# Patient Record
Sex: Female | Born: 1965
Health system: Southern US, Community
[De-identification: ages and names within clinical notes are randomized; demographics above are authoritative.]

## PROBLEM LIST (undated history)

## (undated) DIAGNOSIS — R5383 Other fatigue: Secondary | ICD-10-CM

## (undated) DIAGNOSIS — E785 Hyperlipidemia, unspecified: Secondary | ICD-10-CM

## (undated) DIAGNOSIS — M549 Dorsalgia, unspecified: Secondary | ICD-10-CM

## (undated) DIAGNOSIS — M5136 Other intervertebral disc degeneration, lumbar region: Secondary | ICD-10-CM

## (undated) DIAGNOSIS — E041 Nontoxic single thyroid nodule: Secondary | ICD-10-CM

## (undated) DIAGNOSIS — C801 Malignant (primary) neoplasm, unspecified: Secondary | ICD-10-CM

## (undated) DIAGNOSIS — D649 Anemia, unspecified: Secondary | ICD-10-CM

## (undated) DIAGNOSIS — E559 Vitamin D deficiency, unspecified: Secondary | ICD-10-CM

## (undated) DIAGNOSIS — M51369 Other intervertebral disc degeneration, lumbar region without mention of lumbar back pain or lower extremity pain: Secondary | ICD-10-CM

## (undated) DIAGNOSIS — K635 Polyp of colon: Secondary | ICD-10-CM

## (undated) DIAGNOSIS — M25559 Pain in unspecified hip: Secondary | ICD-10-CM

## (undated) DIAGNOSIS — M810 Age-related osteoporosis without current pathological fracture: Secondary | ICD-10-CM

## (undated) DIAGNOSIS — E079 Disorder of thyroid, unspecified: Secondary | ICD-10-CM

## (undated) HISTORY — DX: Vitamin D deficiency, unspecified: E55.9

## (undated) HISTORY — DX: Other fatigue: R53.83

## (undated) HISTORY — DX: Hyperlipidemia, unspecified: E78.5

## (undated) HISTORY — DX: Age-related osteoporosis without current pathological fracture: M81.0

## (undated) HISTORY — DX: Other intervertebral disc degeneration, lumbar region without mention of lumbar back pain or lower extremity pain: M51.369

## (undated) HISTORY — DX: Dorsalgia, unspecified: M54.9

## (undated) HISTORY — DX: Nontoxic single thyroid nodule: E04.1

## (undated) HISTORY — DX: Polyp of colon: K63.5

## (undated) HISTORY — PX: SALPINGOOPHORECTOMY: SHX82

## (undated) HISTORY — DX: Pain in unspecified hip: M25.559

## (undated) HISTORY — DX: Disorder of thyroid, unspecified: E07.9

## (undated) HISTORY — DX: Other intervertebral disc degeneration, lumbar region: M51.36

---

## 1996-08-02 HISTORY — PX: ECTOPIC PREGNANCY SURGERY: SHX613

## 2008-08-02 HISTORY — PX: ENDOMETRIAL ABLATION: SHX621

## 2009-08-02 HISTORY — PX: TUBAL LIGATION: SHX77

## 2010-01-05 ENCOUNTER — Emergency Department (HOSPITAL_BASED_OUTPATIENT_CLINIC_OR_DEPARTMENT_OTHER): Admission: EM | Admit: 2010-01-05 | Discharge: 2010-01-05 | Payer: Self-pay | Admitting: Emergency Medicine

## 2010-01-05 ENCOUNTER — Ambulatory Visit: Payer: Self-pay | Admitting: Radiology

## 2010-10-19 LAB — URINALYSIS, ROUTINE W REFLEX MICROSCOPIC
Glucose, UA: NEGATIVE mg/dL
Specific Gravity, Urine: 1.022 (ref 1.005–1.030)
Urobilinogen, UA: 0.2 mg/dL (ref 0.0–1.0)
pH: 6 (ref 5.0–8.0)

## 2010-10-19 LAB — DIFFERENTIAL
Lymphs Abs: 1.9 10*3/uL (ref 0.7–4.0)
Monocytes Absolute: 0.2 10*3/uL (ref 0.1–1.0)
Monocytes Relative: 5 % (ref 3–12)

## 2010-10-19 LAB — BASIC METABOLIC PANEL
BUN: 12 mg/dL (ref 6–23)
Chloride: 107 mEq/L (ref 96–112)
GFR calc Af Amer: >60 mL/min (ref >60)
GFR calc non Af Amer: 60 mL/min — ABNORMAL LOW (ref >60)
Glucose, Bld: 119 mg/dL — ABNORMAL HIGH (ref 70–99)
Potassium: 3.6 mEq/L (ref 3.5–5.1)
Sodium: 142 mEq/L (ref 135–145)

## 2010-10-19 LAB — CBC
HCT: 36.7 % (ref 36.0–46.0)
MCHC: 33.6 g/dL (ref 30.0–36.0)
MCV: 84.1 fL (ref 78.0–100.0)
RBC: 4.37 MIL/uL (ref 3.87–5.11)
RDW: 15.9 % — ABNORMAL HIGH (ref 11.5–15.5)
WBC: 4.8 10*3/uL (ref 4.0–10.5)

## 2010-10-19 LAB — PREGNANCY, URINE: Preg Test, Ur: NEGATIVE

## 2010-10-19 LAB — URINE MICROSCOPIC-ADD ON

## 2012-09-24 ENCOUNTER — Encounter (HOSPITAL_BASED_OUTPATIENT_CLINIC_OR_DEPARTMENT_OTHER): Payer: Self-pay | Admitting: *Deleted

## 2012-09-24 ENCOUNTER — Emergency Department (HOSPITAL_BASED_OUTPATIENT_CLINIC_OR_DEPARTMENT_OTHER)
Admission: EM | Admit: 2012-09-24 | Discharge: 2012-09-24 | Disposition: A | Discharge status: Home / Self Care | Payer: 59 | Attending: Emergency Medicine | Admitting: Emergency Medicine

## 2012-09-24 DIAGNOSIS — S298XXA Other specified injuries of thorax, initial encounter: Secondary | ICD-10-CM | POA: Insufficient documentation

## 2012-09-24 DIAGNOSIS — R10A0 Flank pain, unspecified side: Secondary | ICD-10-CM

## 2012-09-24 DIAGNOSIS — R109 Unspecified abdominal pain: Secondary | ICD-10-CM

## 2012-09-24 DIAGNOSIS — X58XXXA Exposure to other specified factors, initial encounter: Secondary | ICD-10-CM | POA: Insufficient documentation

## 2012-09-24 DIAGNOSIS — Z9851 Tubal ligation status: Secondary | ICD-10-CM | POA: Insufficient documentation

## 2012-09-24 DIAGNOSIS — Y939 Activity, unspecified: Secondary | ICD-10-CM | POA: Insufficient documentation

## 2012-09-24 DIAGNOSIS — F17200 Nicotine dependence, unspecified, uncomplicated: Secondary | ICD-10-CM | POA: Insufficient documentation

## 2012-09-24 DIAGNOSIS — Y9289 Other specified places as the place of occurrence of the external cause: Secondary | ICD-10-CM | POA: Insufficient documentation

## 2012-09-24 DIAGNOSIS — N390 Urinary tract infection, site not specified: Secondary | ICD-10-CM

## 2012-09-24 DIAGNOSIS — R110 Nausea: Secondary | ICD-10-CM | POA: Insufficient documentation

## 2012-09-24 DIAGNOSIS — Y990 Civilian activity done for income or pay: Secondary | ICD-10-CM | POA: Insufficient documentation

## 2012-09-24 LAB — CBC WITH DIFFERENTIAL/PLATELET
Basophils Absolute: 0 K/uL (ref 0.0–0.1)
Basophils Relative: 0 % (ref 0–1)
Eosinophils Absolute: 0 K/uL (ref 0.0–0.7)
Eosinophils Relative: 1 % (ref 0–5)
HCT: 40.8 % (ref 36.0–46.0)
Hemoglobin: 13.8 g/dL (ref 12.0–15.0)
Lymphocytes Relative: 15 % (ref 12–46)
Lymphs Abs: 1.1 K/uL (ref 0.7–4.0)
MCH: 29.3 pg (ref 26.0–34.0)
MCHC: 33.8 g/dL (ref 30.0–36.0)
MCV: 86.6 fL (ref 78.0–100.0)
Monocytes Absolute: 0.3 K/uL (ref 0.1–1.0)
Monocytes Relative: 4 % (ref 3–12)
Neutro Abs: 6.2 K/uL (ref 1.7–7.7)
Neutrophils Relative %: 81 % — ABNORMAL HIGH (ref 43–77)
Platelets: 179 K/uL (ref 150–400)
RBC: 4.71 MIL/uL (ref 3.87–5.11)
RDW: 13.5 % (ref 11.5–15.5)
WBC: 7.7 K/uL (ref 4.0–10.5)

## 2012-09-24 LAB — COMPREHENSIVE METABOLIC PANEL WITH GFR
ALT: 14 U/L (ref 0–35)
AST: 18 U/L (ref 0–37)
Albumin: 4 g/dL (ref 3.5–5.2)
Alkaline Phosphatase: 78 U/L (ref 39–117)
BUN: 11 mg/dL (ref 6–23)
CO2: 25 meq/L (ref 19–32)
Calcium: 9.1 mg/dL (ref 8.4–10.5)
Chloride: 105 meq/L (ref 96–112)
Creatinine, Ser: 0.9 mg/dL (ref 0.50–1.10)
GFR calc Af Amer: 86 mL/min — ABNORMAL LOW (ref >90)
GFR calc non Af Amer: 74 mL/min — ABNORMAL LOW (ref >90)
Glucose, Bld: 94 mg/dL (ref 70–99)
Potassium: 3.7 meq/L (ref 3.5–5.1)
Sodium: 139 meq/L (ref 135–145)
Total Bilirubin: 0.3 mg/dL (ref 0.3–1.2)
Total Protein: 7.1 g/dL (ref 6.0–8.3)

## 2012-09-24 LAB — URINALYSIS, ROUTINE W REFLEX MICROSCOPIC
Bilirubin Urine: NEGATIVE
Glucose, UA: NEGATIVE mg/dL
Hgb urine dipstick: NEGATIVE
Ketones, ur: NEGATIVE mg/dL
Nitrite: POSITIVE — AB
Protein, ur: NEGATIVE mg/dL
Specific Gravity, Urine: 1.02 (ref 1.005–1.030)
Urobilinogen, UA: 0.2 mg/dL (ref 0.0–1.0)
pH: 7 (ref 5.0–8.0)

## 2012-09-24 LAB — URINE MICROSCOPIC-ADD ON

## 2012-09-24 MED ORDER — HYDROCODONE-ACETAMINOPHEN 5-325 MG PO TABS: 2.0000 | ORAL_TABLET | Freq: Four times a day (QID) | ORAL | Status: DC | PRN

## 2012-09-24 MED ORDER — CIPROFLOXACIN HCL 250 MG PO TABS: 250.0000 mg | ORAL_TABLET | Freq: Two times a day (BID) | ORAL | Status: DC

## 2012-09-24 NOTE — ED Provider Notes (Signed)
History     CSN: [id number]  Arrival date & time 09/24/12  1155   First MD Initiated Contact with Patient 09/24/12 1220      Chief Complaint  Patient presents with  . Rib Injury    (Consider location/radiation/quality/duration/timing/severity/associated sxs/prior treatment) HPI Comments: Patient states she was at work on Friday, tripped, and grabbed the railing on the stairs to keep from falling.  For the past two days she has had worsening discomfort in the left upper abdomen.  There is no n/v/d.  She denies urinary or bowel complaints.  There are no fevers or chills.    The history is provided by the patient.    History reviewed. No pertinent past medical history.  Past Surgical History  Procedure Laterality Date  . Tubal ligation      History reviewed. No pertinent family history.  History  Substance Use Topics  . Smoking status: Current Some Day Smoker  . Smokeless tobacco: Not on file  . Alcohol Use: No    OB History   Grav Para Term Preterm Abortions TAB SAB Ect Mult Living                  Review of Systems  All other systems reviewed and are negative.    Allergies  Review of patient's allergies indicates no known allergies.  Home Medications  No current outpatient prescriptions on file.  BP 152/90  Pulse 61  Temp(Src) 97.6 F (36.4 C) (Oral)  Resp 20  Ht 5\' 3"  (1.6 m)  Wt 170 lb (77.111 kg)  BMI 30.12 kg/m2  SpO2 100%  LMP 08/24/2012  Physical Exam  Nursing note and vitals reviewed. Constitutional: She is oriented to person, place, and time. She appears well-developed and well-nourished. No distress.  HENT:  Head: Normocephalic and atraumatic.  Neck: Normal range of motion. Neck supple.  Cardiovascular: Normal rate and regular rhythm.  Exam reveals no gallop and no friction rub.   No murmur heard. Pulmonary/Chest: Effort normal and breath sounds normal. No respiratory distress. She has no wheezes.  Abdominal: Soft. Bowel sounds are  normal. She exhibits no distension.  There is mild ttp in the left upper quadrant without rebound or guarding.  Bowel sounds are present.   Musculoskeletal: Normal range of motion.  Neurological: She is alert and oriented to person, place, and time.  Skin: Skin is warm and dry. She is not diaphoretic.    ED Course  Procedures (including critical care time)  Labs Reviewed - No data to display No results found.   No diagnosis found.    MDM  Patient presents here with symptoms of left upper quadrant and flank discomfort that started the day after a near fall at work.  There is no elevation of wbc or lipase.  The ua shows uti that i suspect is the cause of her symptoms.  Will treat with cipro, pain meds, follow up prn.        Geoffery Lyons, MD 09/24/12 1335

## 2012-09-24 NOTE — ED Notes (Signed)
Pt states she was at work Friday and tripped on steps. Tried to catch herself and ? Pulled something in her left rib/side area. Pain worse today +nausea at times

## 2012-09-26 LAB — URINE CULTURE: Culture: NO GROWTH

## 2014-01-24 HISTORY — PX: COLONOSCOPY: SHX174

## 2014-07-09 ENCOUNTER — Encounter: Payer: Self-pay | Admitting: Internal Medicine

## 2014-08-29 ENCOUNTER — Ambulatory Visit: Payer: Self-pay | Admitting: Internal Medicine

## 2014-09-26 ENCOUNTER — Ambulatory Visit (INDEPENDENT_AMBULATORY_CARE_PROVIDER_SITE_OTHER): Payer: PRIVATE HEALTH INSURANCE | Admitting: Internal Medicine

## 2014-09-26 ENCOUNTER — Encounter: Payer: Self-pay | Admitting: Internal Medicine

## 2014-09-26 VITALS — BP 118/78 | HR 83 | Temp 97.9°F | Resp 14 | Ht 64.0 in | Wt 186.8 lb

## 2014-09-26 DIAGNOSIS — C73 Malignant neoplasm of thyroid gland: Secondary | ICD-10-CM

## 2014-09-26 DIAGNOSIS — E041 Nontoxic single thyroid nodule: Secondary | ICD-10-CM | POA: Insufficient documentation

## 2014-09-26 NOTE — Progress Notes (Addendum)
Patient ID: Melinda Vazquez, female   DOB: 02-10-1966, 49 y.o.   MRN: [id number]   HPI  Melinda Vazquez is a 49 y.o.-year-old female, referred by her PCP, Dr. Lennice Sites [facility]), for management of a R thyroid nodule.  Pt was seen by PCP in 06/2014 and c/o feeling a neck mass >> she was sent for a thyroid U/S.  Thyroid U/S ([facility], 06/26/2014): 3.7 x 4.1 x 1.9 cm, complex, vascular.  Pt denies feeling nodules in neck, hoarseness, dysphagia/odynophagia, SOB with lying down.  Last TSH: 06/26/2014: TSH 0.45  Pt c/o: - + hot flushes - + weight gain (7-8 lbs in last 7-8 mo) - no tremors - no palpitations - no anxiety/depression - no hyperdefecation/+ off and on constipation - no dry skin - no hair falling - no fatigue  Pt does have a FH of thyroid ds. in daughter - thyroid cancer (? Type). No h/o radiation tx to head or neck.  No seaweed or kelp, no recent contrast studies. No steroid use. No herbal supplements.   ROS: Constitutional: no weight gain/loss, no fatigue, no subjective hyperthermia/hypothermia Eyes: no blurry vision, no xerophthalmia ENT: no sore throat, no nodules palpated in throat, no dysphagia/odynophagia, no hoarseness Cardiovascular: no CP/SOB/palpitations/leg swelling Respiratory: no cough/SOB Gastrointestinal: no N/V/D/+ C Musculoskeletal: no muscle/+ joint aches Skin: no rashes Neurological: no tremors/numbness/tingling/dizziness Psychiatric: no depression/anxiety + low libido  Past Medical History  Diagnosis Date  . Hyperlipidemia   . Vitamin D deficiency   . Degenerative disc disease, lumbar     L5-S1  . Osteoporosis   . Fatigue   . Acute back pain   . Acute hip pain   . Colon polyp   . Thyroid mass   . Thyroid nodule    Past Surgical History  Procedure Laterality Date  . Tubal ligation    . Salpingoophorectomy    . Endometrial ablation    . Colonoscopy  01/24/14   History   Social History  . Marital Status: Single   Spouse Name: N/A  . Number of Children: 2   Occupational History  . Small business customer svs rep   Social History Main Topics  . Smoking status: Current Some Day Smoker  . Smokeless tobacco: Not on file  . Alcohol Use: Occasional, red wine  . Drug Use: No  . Sexual Activity: Yes    Birth Control/ Protection: Surgical   Social History Narrative   Bone Density Study: 09/06/13   Pap Smear: 12/2012   Mammogram: 09/2012      Current Outpatient Prescriptions on File Prior to Visit  Medication Sig Dispense Refill  . HYDROcodone-acetaminophen (NORCO/VICODIN) 5-325 MG per tablet Take 2 tablets by mouth every 6 (six) hours as needed for pain. 20 tablet 0  . ciprofloxacin (CIPRO) 250 MG tablet Take 1 tablet (250 mg total) by mouth every 12 (twelve) hours. (Patient not taking: Reported on 09/26/2014) 10 tablet 0   No current facility-administered medications on file prior to visit.   No Known Allergies Family History  Problem Relation Age of Onset  . Diabetes Mother     DM Type II   . Hyperlipidemia Mother   . Hypertension Mother   . Diabetes Father     DM Type II   . Hyperlipidemia Father   . Heart disease Father   . Hypertension Father    PE: BP 118/78 mmHg  Pulse 83  Temp(Src) 97.9 F (36.6 C) (Oral)  Resp 14  Ht  5\' 4"  (1.626 m)  Wt 186 lb 12.8 oz (84.732 kg)  BMI 32.05 kg/m2  SpO2 98% Wt Readings from Last 3 Encounters:  09/26/14 186 lb 12.8 oz (84.732 kg)  09/24/12 170 lb (77.111 kg)   Constitutional: overweight, in NAD Eyes: PERRLA, EOMI, no exophthalmos ENT: moist mucous membranes, + thyromegaly - R lower lobe palpable, no cervical lymphadenopathy Cardiovascular: RRR, No MRG Respiratory: CTA B Gastrointestinal: abdomen soft, NT, ND, BS+ Musculoskeletal: no deformities, strength intact in all 4;  Skin: moist, warm, no rashes Neurological: no tremor with outstretched hands, DTR normal in all 4  ASSESSMENT: 1. R thyroid nodule  PLAN: 1. R thyroid nodule - I  reviewed the report of her thyroid ultrasound from [place] (images not available) along with the patient. I pointed out that the dominant nodule is large, this being a risk factor for cancer. Pt's daughter had thyroid cancer (? type). No personal history of RxTx to head/neck.  - the only way that we can tell exactly if it is cancer or not is by doing a thyroid biopsy (FNA). I explained what the test entails. - We discussed about other options, to wait for another 6 months to a year and see if the nodule grows, and only intervene at that time.  - I explained that this is not cancer, we can continue to follow her on a yearly basis, and check another ultrasound in another year or 2. - patient decided to have the FNA done now >> I ordered this. We discussed about the FNA technique and also given her written instructions. - I'll see her back in a year, assuming her FNA is normal. If FNA abnormal, we will meet sooner.  - I will not repeat the thyroid tests since they were normal before (we called PCP's office and obtain the latest TSH from 06/2014) - I advised pt to join my chart and I will send her the results through there or call her  Orders Placed This Encounter  Procedures  . US Thyroid Biopsy   Bx scheduled for 10/15/2014. I will addend the results when they become available.  Adequacy Reason Satisfactory For Evaluation. Diagnosis THYROID, FINE NEEDLE ASPIRATION, MIDLINE INFERIOR ISTHMUS (SPECIMEN 1 OF 1, COLLECTED ON 10/15/14): MALIGNANT ([facility] CATEGORY VI). FINDINGS CONSISTENT WITH PAPILLARY THYROID CARCINOMA. [name] MD Pathologist, Electronic Signature (Case signed 10/16/2014) Specimen Clinical Information 3.7 x 4.1 x 1.9 cm heterogeneous hypervascular nodule inferior thyroid isthmus Source Thyroid, Fine Needle Aspiration, Midline Inferior Isthmus, (Specimen 1 of 1, collected on 10/15/14 )  Called and d/w pt >> agrees with referral to surgery.

## 2014-09-26 NOTE — Patient Instructions (Signed)
You will be called with the schedule for the Biopsy.  Please return in 1 year.  Thyroid Biopsy The thyroid gland is a butterfly-shaped gland situated in the front of the neck. It produces hormones which affect metabolism, growth and development, and body temperature. A thyroid biopsy is a procedure in which small samples of tissue or fluid are removed from the thyroid gland or mass and examined under a microscope. This test is done to determine the cause of thyroid problems, such as infection, cancer, or other thyroid problems. There are 2 ways to obtain samples: 1. Fine needle biopsy. Samples are removed using a thin needle inserted through the skin and into the thyroid gland or mass. 2. Open biopsy. Samples are removed after a cut (incision) is made through the skin. LET YOUR CAREGIVER KNOW ABOUT:   Allergies.  Medications taken including herbs, eye drops, over-the-counter medications, and creams.  Use of steroids (by mouth or creams).  Previous problems with anesthetics or numbing medicine.  Possibility of pregnancy, if this applies.  History of blood clots (thrombophlebitis).  History of bleeding or blood problems.  Previous surgery.  Other health problems. RISKS AND COMPLICATIONS  Bleeding from the site. The risk of bleeding is higher if you have a bleeding disorder or are taking any blood thinning medications (anticoagulants).  Infection.  Injury to structures near the thyroid gland. BEFORE THE PROCEDURE  This is a procedure that can be done as an outpatient. Confirm the time that you need to arrive for your procedure. Confirm whether there is a need to fast or withhold any medications. A blood sample may be done to determine your blood clotting time. Medicine may be given to help you relax (sedative). PROCEDURE Fine needle biopsy. You will be awake during the procedure. You may be asked to lie on your back with your head tipped backward to extend your neck. Let your  caregiver know if you cannot tolerate the positioning. An area on your neck will be cleansed. A needle is inserted through the skin of your neck. You may feel a mild discomfort during this procedure. You may be asked to avoid coughing, talking, swallowing, or making sounds during some portions of the procedure. The needle is withdrawn once tissue or fluid samples have been removed. Pressure may be applied to the neck to reduce swelling and ensure that bleeding has stopped. The samples will be sent for examination.  Open biopsy. You will be given general anesthesia. You will be asleep during the procedure. An incision is made in your neck. A sample of thyroid tissue or the mass is removed. The tissue sample or mass will be sent for examination. The sample or mass may be examined during the biopsy. If the sample or mass contains cancer cells, some or all of the thyroid gland may be removed. The incision is closed with stitches. AFTER THE PROCEDURE  Your recovery will be assessed and monitored. If there are no problems, as an outpatient, you should be able to go home shortly after the procedure. If you had a fine needle biopsy:  You may have soreness at the biopsy site for 1 to 2 days. If you had an open biopsy:   You may have soreness at the biopsy site for 3 to 4 days.  You may have a hoarse voice or sore throat for 1 to 2 days. Obtaining the Test Results It is your responsibility to obtain your test results. Do not assume everything is normal if you have  not heard from your caregiver or the medical facility. It is important for you to follow up on all of your test results. HOME CARE INSTRUCTIONS   Keeping your head raised on a pillow when you are lying down may ease biopsy site discomfort.  Supporting the back of your head and neck with both hands as you sit up from a lying position may ease biopsy site discomfort.  Only take over-the-counter or prescription medicines for pain, discomfort, or  fever as directed by your caregiver.  Throat lozenges or gargling with warm salt water may help to soothe a sore throat. SEEK IMMEDIATE MEDICAL CARE IF:   You have severe bleeding from the biopsy site.  You have difficulty swallowing.  You have a fever.  You have increased pain, swelling, redness, or warmth at the biopsy site.  You notice pus coming from the biopsy site.  You have swollen glands (lymph nodes) in your neck. Document Released: 05/16/2007 Document Revised: 11/13/2012 Document Reviewed: 10/11/2013 [facility] Patient Information 2015 [place], [place]. This information is not intended to replace advice given to you by your health care provider. Make sure you discuss any questions you have with your health care provider.

## 2014-10-01 DIAGNOSIS — C801 Malignant (primary) neoplasm, unspecified: Secondary | ICD-10-CM

## 2014-10-01 HISTORY — DX: Malignant (primary) neoplasm, unspecified: C80.1

## 2014-10-01 HISTORY — PX: BIOPSY THYROID: PRO38

## 2014-10-15 ENCOUNTER — Other Ambulatory Visit (HOSPITAL_COMMUNITY)
Admission: RE | Admit: 2014-10-15 | Discharge: 2014-10-15 | Disposition: A | Discharge status: Home / Self Care | Payer: PRIVATE HEALTH INSURANCE | Source: Ambulatory Visit | Attending: Interventional Radiology | Admitting: Interventional Radiology

## 2014-10-15 ENCOUNTER — Ambulatory Visit
Admission: RE | Admit: 2014-10-15 | Discharge: 2014-10-15 | Disposition: A | Discharge status: Home / Self Care | Payer: PRIVATE HEALTH INSURANCE | Source: Ambulatory Visit | Attending: Internal Medicine | Admitting: Internal Medicine

## 2014-10-15 DIAGNOSIS — E041 Nontoxic single thyroid nodule: Secondary | ICD-10-CM | POA: Insufficient documentation

## 2014-10-16 ENCOUNTER — Telehealth: Payer: Self-pay | Admitting: *Deleted

## 2014-10-16 NOTE — Telephone Encounter (Signed)
Dr Saralyn Pilar with Pathology at [facility] called stating the diagnosis of the pt's biopsy is malignant papillary thyroid carcinoma. Be advised.

## 2014-10-17 NOTE — Addendum Note (Signed)
Addended by: Philemon Kingdom on: 10/17/2014 06:29 PM   Modules accepted: Orders, Level of Service

## 2014-10-21 ENCOUNTER — Telehealth: Payer: Self-pay | Admitting: Internal Medicine

## 2014-10-21 NOTE — Telephone Encounter (Signed)
Left a message for the pt to return my call.  

## 2014-10-21 NOTE — Telephone Encounter (Signed)
I left a detailed message with the information below and asked that she call me back.

## 2014-10-21 NOTE — Telephone Encounter (Signed)
Completed-see prior note.

## 2014-10-21 NOTE — Telephone Encounter (Signed)
Pt needs call back regarding questions for D.r Cruzita Lederer

## 2014-10-21 NOTE — Telephone Encounter (Signed)
Patient states when she was seen her mind was wondering at the last visit and she wanted to clarify if Dr Cruzita Lederer told her she has cancer cells in the thyroid?  Requests Dr Cruzita Lederer call her if possible when she goes to [facility] around 1:30pm and if she does not answer, it is OK to leave a message with this information.

## 2014-10-21 NOTE — Telephone Encounter (Signed)
Please call pt >> yes, as I talked with her over the phone, the pathology was positive for cancer in the biopsied nodule: "malignant papillary thyroid carcinoma". Was she called by the surgeon's office?

## 2014-10-21 NOTE — Telephone Encounter (Signed)
I spoke with the pt and explained to her per Dr Cruzita Lederer there was cancer noticed only in the cells that were removed; not in the entire thyroid and she agreed.  Also stated the surgeons office has called her.

## 2014-10-21 NOTE — Telephone Encounter (Signed)
Patient called stating she was returning Dr. Cruzita Lederer call   Please advise patient    Thank you

## 2014-11-07 ENCOUNTER — Ambulatory Visit: Payer: Self-pay | Admitting: Surgery

## 2014-11-22 NOTE — Patient Instructions (Addendum)
YOUR PROCEDURE IS SCHEDULED ON :  11/29/14  REPORT TO [facility] MAIN ENTRANCE FOLLOW SIGNS TO SHORT STAY CENTER AT :  5:30 AM  CALL THIS NUMBER IF YOU HAVE PROBLEMS THE MORNING OF SURGERY [phone]  REMEMBER:  DO NOT EAT FOOD OR DRINK LIQUIDS AFTER MIDNIGHT   STOP ASPIRIN / IBUPROFEN / HERBAL MEDS / VITAMINS 5 DAYS PREOP  YOU MAY NOT HAVE ANY METAL ON YOUR BODY INCLUDING HAIR PINS AND PIERCING'S. DO NOT WEAR JEWELRY, MAKEUP, LOTIONS, POWDERS OR PERFUMES. DO NOT WEAR NAIL POLISH. DO NOT SHAVE 48 HRS PRIOR TO SURGERY. MEN MAY SHAVE FACE AND NECK.  DO NOT [facility]. [facility] IS NOT RESPONSIBLE FOR VALUABLES.  CONTACTS, DENTURES OR PARTIALS MAY NOT BE WORN TO SURGERY. LEAVE SUITCASE IN CAR. CAN BE BROUGHT TO ROOM AFTER SURGERY.  PATIENTS DISCHARGED THE DAY OF SURGERY WILL NOT BE ALLOWED TO DRIVE HOME.  PLEASE READ OVER THE FOLLOWING INSTRUCTION SHEETS _________________________________________________________________________________                                          [place] - PREPARING FOR SURGERY  Before surgery, you can play an important role.  Because skin is not sterile, your skin needs to be as free of germs as possible.  You can reduce the number of germs on your skin by washing with CHG (chlorahexidine gluconate) soap before surgery.  CHG is an antiseptic cleaner which kills germs and bonds with the skin to continue killing germs even after washing. Please DO NOT use if you have an allergy to CHG or antibacterial soaps.  If your skin becomes reddened/irritated stop using the CHG and inform your nurse when you arrive at Short Stay. Do not shave (including legs and underarms) for at least 48 hours prior to the first CHG shower.  You may shave your face. Please follow these instructions carefully:   1.  Shower with CHG Soap the night before surgery and the  morning of Surgery.   2.  If you choose to wash your hair, wash your  hair first as usual with your  normal  Shampoo.   3.  After you shampoo, rinse your hair and body thoroughly to remove the  shampoo.                                         4.  Use CHG as you would any other liquid soap.  You can apply chg directly  to the skin and wash . Gently wash with scrungie or clean wascloth    5.  Apply the CHG Soap to your body ONLY FROM THE NECK DOWN.   Do not use on open                           Wound or open sores. Avoid contact with eyes, ears mouth and genitals (private parts).                        Genitals (private parts) with your normal soap.              6.  Wash thoroughly, paying special attention to the area where your surgery  will be performed.   7.  Thoroughly rinse your body with warm water from the neck down.   8.  DO NOT shower/wash with your normal soap after using and rinsing off  the CHG Soap .                9.  Pat yourself dry with a clean towel.             10.  Wear clean night clothes to bed after shower             11.  Place clean sheets on your bed the night of your first shower and do not  sleep with pets.  Day of Surgery : Do not apply any lotions/deodorants the morning of surgery.  Please wear clean clothes to the hospital/surgery center.  FAILURE TO FOLLOW THESE INSTRUCTIONS MAY RESULT IN THE CANCELLATION OF YOUR SURGERY    PATIENT SIGNATURE_________________________________  ______________________________________________________________________

## 2014-11-25 ENCOUNTER — Ambulatory Visit (HOSPITAL_COMMUNITY)
Admission: RE | Admit: 2014-11-25 | Discharge: 2014-11-25 | Disposition: A | Discharge status: Home / Self Care | Payer: 59 | Source: Ambulatory Visit | Attending: Anesthesiology | Admitting: Anesthesiology

## 2014-11-25 ENCOUNTER — Encounter (HOSPITAL_COMMUNITY): Payer: Self-pay

## 2014-11-25 ENCOUNTER — Encounter (HOSPITAL_COMMUNITY)
Admission: RE | Admit: 2014-11-25 | Discharge: 2014-11-25 | Disposition: A | Discharge status: Home / Self Care | Payer: 59 | Source: Ambulatory Visit | Attending: Surgery | Admitting: Surgery

## 2014-11-25 DIAGNOSIS — Z01818 Encounter for other preprocedural examination: Secondary | ICD-10-CM | POA: Diagnosis not present

## 2014-11-25 DIAGNOSIS — E079 Disorder of thyroid, unspecified: Secondary | ICD-10-CM | POA: Diagnosis not present

## 2014-11-25 HISTORY — DX: Anemia, unspecified: D64.9

## 2014-11-25 HISTORY — DX: Malignant (primary) neoplasm, unspecified: C80.1

## 2014-11-25 LAB — BASIC METABOLIC PANEL
Anion gap: 11 (ref 5–15)
BUN: 17 mg/dL (ref 6–23)
CHLORIDE: 107 mmol/L (ref 96–112)
CO2: 26 mmol/L (ref 19–32)
Calcium: 9.9 mg/dL (ref 8.4–10.5)
Creatinine, Ser: 1.02 mg/dL (ref 0.50–1.10)
GFR calc non Af Amer: 63 mL/min — ABNORMAL LOW (ref >90)
GFR, EST AFRICAN AMERICAN: 73 mL/min — AB (ref >90)
Glucose, Bld: 104 mg/dL — ABNORMAL HIGH (ref 70–99)
POTASSIUM: 3.9 mmol/L (ref 3.5–5.1)
SODIUM: 144 mmol/L (ref 135–145)

## 2014-11-25 LAB — CBC
HCT: 42.4 % (ref 36.0–46.0)
Hemoglobin: 13.8 g/dL (ref 12.0–15.0)
MCH: 28.6 pg (ref 26.0–34.0)
MCHC: 32.5 g/dL (ref 30.0–36.0)
MCV: 87.8 fL (ref 78.0–100.0)
Platelets: 220 10*3/uL (ref 150–400)
RBC: 4.83 MIL/uL (ref 3.87–5.11)
RDW: 13.7 % (ref 11.5–15.5)
WBC: 4 10*3/uL (ref 4.0–10.5)

## 2014-11-25 NOTE — Progress Notes (Signed)
LOV note 11/04/14 Melinda Vazquez [facility] on chart, Chest x-ray and EKG 09/06/13 on chart, ECHO 10/23/14 on chart

## 2014-11-26 NOTE — Progress Notes (Signed)
Quick Note:  Pre-operative chest x-ray is acceptable for scheduled surgery.  Katalaya Beel M. Camarie Mctigue, MD, FACS [facility] Office: [phone]   ______ 

## 2014-11-26 NOTE — Progress Notes (Signed)
Quick Note:  These results are acceptable for scheduled surgery.  Anelise Staron M. Aluel Schwarz, MD, FACS [facility] Office: [phone]   ______ 

## 2014-11-29 ENCOUNTER — Ambulatory Visit (HOSPITAL_COMMUNITY): Payer: 59 | Admitting: Anesthesiology

## 2014-11-29 ENCOUNTER — Encounter (HOSPITAL_COMMUNITY): Payer: Self-pay | Admitting: *Deleted

## 2014-11-29 ENCOUNTER — Encounter (HOSPITAL_COMMUNITY)
Admission: RE | Disposition: A | Discharge status: Home / Self Care | Payer: Self-pay | Source: Ambulatory Visit | Attending: Surgery

## 2014-11-29 ENCOUNTER — Observation Stay (HOSPITAL_COMMUNITY)
Admission: RE | Admit: 2014-11-29 | Discharge: 2014-11-30 | Disposition: A | Discharge status: Home / Self Care | Payer: 59 | Source: Ambulatory Visit | Attending: Surgery | Admitting: Surgery

## 2014-11-29 DIAGNOSIS — M1990 Unspecified osteoarthritis, unspecified site: Secondary | ICD-10-CM | POA: Insufficient documentation

## 2014-11-29 DIAGNOSIS — F17200 Nicotine dependence, unspecified, uncomplicated: Secondary | ICD-10-CM | POA: Diagnosis not present

## 2014-11-29 DIAGNOSIS — C73 Malignant neoplasm of thyroid gland: Principal | ICD-10-CM | POA: Diagnosis present

## 2014-11-29 HISTORY — PX: LYMPH NODE DISSECTION: SHX5087

## 2014-11-29 HISTORY — PX: THYROIDECTOMY: SHX17

## 2014-11-29 SURGERY — THYROIDECTOMY
Anesthesia: General | Site: Neck

## 2014-11-29 MED ORDER — PROPOFOL 10 MG/ML IV BOLUS
INTRAVENOUS | Status: AC
  Filled 2014-11-29: qty 20

## 2014-11-29 MED ORDER — CEFAZOLIN SODIUM-DEXTROSE 2-3 GM-% IV SOLR
2.0000 g | INTRAVENOUS | Status: AC
  Administered 2014-11-29: 2 g via INTRAVENOUS

## 2014-11-29 MED ORDER — FENTANYL CITRATE (PF) 100 MCG/2ML IJ SOLN
25.0000 ug | INTRAMUSCULAR | Status: DC | PRN
  Administered 2014-11-29 (×2): 50 ug via INTRAVENOUS

## 2014-11-29 MED ORDER — ROCURONIUM BROMIDE 100 MG/10ML IV SOLN
INTRAVENOUS | Status: DC | PRN
  Administered 2014-11-29: 40 mg via INTRAVENOUS

## 2014-11-29 MED ORDER — DEXAMETHASONE SODIUM PHOSPHATE 10 MG/ML IJ SOLN
INTRAMUSCULAR | Status: AC
  Filled 2014-11-29: qty 1

## 2014-11-29 MED ORDER — MIDAZOLAM HCL 2 MG/2ML IJ SOLN
INTRAMUSCULAR | Status: AC
  Filled 2014-11-29: qty 2

## 2014-11-29 MED ORDER — GLYCOPYRROLATE 0.2 MG/ML IJ SOLN
INTRAMUSCULAR | Status: DC | PRN
  Administered 2014-11-29: 0.6 mg via INTRAVENOUS

## 2014-11-29 MED ORDER — NEOSTIGMINE METHYLSULFATE 10 MG/10ML IV SOLN
INTRAVENOUS | Status: DC | PRN
  Administered 2014-11-29: 4 mg via INTRAVENOUS

## 2014-11-29 MED ORDER — HYDROCODONE-ACETAMINOPHEN 5-325 MG PO TABS
1.0000 | ORAL_TABLET | ORAL | Status: DC | PRN
  Administered 2014-11-29 – 2014-11-30 (×2): 1 via ORAL
  Filled 2014-11-29 (×2): qty 1

## 2014-11-29 MED ORDER — CALCIUM CARBONATE 1250 (500 CA) MG PO TABS
2.0000 | ORAL_TABLET | Freq: Three times a day (TID) | ORAL | Status: DC
  Administered 2014-11-29 – 2014-11-30 (×3): 1000 mg via ORAL
  Filled 2014-11-29 (×7): qty 2

## 2014-11-29 MED ORDER — FENTANYL CITRATE (PF) 100 MCG/2ML IJ SOLN
INTRAMUSCULAR | Status: DC | PRN
  Administered 2014-11-29 (×4): 50 ug via INTRAVENOUS

## 2014-11-29 MED ORDER — GLYCOPYRROLATE 0.2 MG/ML IJ SOLN
INTRAMUSCULAR | Status: AC
  Filled 2014-11-29: qty 3

## 2014-11-29 MED ORDER — SYNTHROID 100 MCG PO TABS: 100.0000 ug | ORAL_TABLET | Freq: Every day | ORAL | Status: DC

## 2014-11-29 MED ORDER — MIDAZOLAM HCL 5 MG/5ML IJ SOLN
INTRAMUSCULAR | Status: DC | PRN
  Administered 2014-11-29: 2 mg via INTRAVENOUS

## 2014-11-29 MED ORDER — PROPOFOL 10 MG/ML IV BOLUS
INTRAVENOUS | Status: DC | PRN
  Administered 2014-11-29: 160 mg via INTRAVENOUS

## 2014-11-29 MED ORDER — FENTANYL CITRATE (PF) 100 MCG/2ML IJ SOLN
INTRAMUSCULAR | Status: AC
  Filled 2014-11-29: qty 2

## 2014-11-29 MED ORDER — LACTATED RINGERS IV SOLN
INTRAVENOUS | Status: DC | PRN
  Administered 2014-11-29: 07:00:00 via INTRAVENOUS

## 2014-11-29 MED ORDER — LACTATED RINGERS IV SOLN: INTRAVENOUS | Status: DC

## 2014-11-29 MED ORDER — PROMETHAZINE HCL 25 MG/ML IJ SOLN: 6.2500 mg | INTRAMUSCULAR | Status: DC | PRN

## 2014-11-29 MED ORDER — DEXAMETHASONE SODIUM PHOSPHATE 10 MG/ML IJ SOLN
INTRAMUSCULAR | Status: DC | PRN
  Administered 2014-11-29: 10 mg via INTRAVENOUS

## 2014-11-29 MED ORDER — ONDANSETRON HCL 4 MG/2ML IJ SOLN
INTRAMUSCULAR | Status: AC
  Filled 2014-11-29: qty 2

## 2014-11-29 MED ORDER — FENTANYL CITRATE (PF) 250 MCG/5ML IJ SOLN
INTRAMUSCULAR | Status: AC
  Filled 2014-11-29: qty 5

## 2014-11-29 MED ORDER — NEOSTIGMINE METHYLSULFATE 10 MG/10ML IV SOLN
INTRAVENOUS | Status: AC
  Filled 2014-11-29: qty 1

## 2014-11-29 MED ORDER — HYDROMORPHONE HCL 1 MG/ML IJ SOLN
1.0000 mg | INTRAMUSCULAR | Status: DC | PRN
  Administered 2014-11-29 (×2): 1 mg via INTRAVENOUS
  Filled 2014-11-29 (×2): qty 1

## 2014-11-29 MED ORDER — LIDOCAINE HCL (CARDIAC) 20 MG/ML IV SOLN
INTRAVENOUS | Status: DC | PRN
  Administered 2014-11-29: 100 mg via INTRAVENOUS

## 2014-11-29 MED ORDER — ONDANSETRON HCL 4 MG PO TABS: 4.0000 mg | ORAL_TABLET | Freq: Four times a day (QID) | ORAL | Status: DC | PRN

## 2014-11-29 MED ORDER — ONDANSETRON HCL 4 MG/2ML IJ SOLN
4.0000 mg | Freq: Four times a day (QID) | INTRAMUSCULAR | Status: DC | PRN
  Administered 2014-11-29 (×2): 4 mg via INTRAVENOUS
  Filled 2014-11-29 (×2): qty 2

## 2014-11-29 MED ORDER — CEFAZOLIN SODIUM-DEXTROSE 2-3 GM-% IV SOLR
INTRAVENOUS | Status: AC
  Filled 2014-11-29: qty 50

## 2014-11-29 MED ORDER — LIDOCAINE HCL (CARDIAC) 20 MG/ML IV SOLN
INTRAVENOUS | Status: AC
  Filled 2014-11-29: qty 5

## 2014-11-29 MED ORDER — MEPERIDINE HCL 50 MG/ML IJ SOLN: 6.2500 mg | INTRAMUSCULAR | Status: DC | PRN

## 2014-11-29 MED ORDER — ROCURONIUM BROMIDE 100 MG/10ML IV SOLN
INTRAVENOUS | Status: AC
  Filled 2014-11-29: qty 1

## 2014-11-29 MED ORDER — ONDANSETRON HCL 4 MG/2ML IJ SOLN
INTRAMUSCULAR | Status: DC | PRN
  Administered 2014-11-29: 4 mg via INTRAVENOUS

## 2014-11-29 MED ORDER — SUCCINYLCHOLINE CHLORIDE 20 MG/ML IJ SOLN
INTRAMUSCULAR | Status: DC | PRN
  Administered 2014-11-29: 100 mg via INTRAVENOUS

## 2014-11-29 MED ORDER — OXYCODONE HCL 5 MG PO TABS: 5.0000 mg | ORAL_TABLET | Freq: Four times a day (QID) | ORAL | Status: DC | PRN

## 2014-11-29 MED ORDER — KCL IN DEXTROSE-NACL 20-5-0.45 MEQ/L-%-% IV SOLN
INTRAVENOUS | Status: DC
  Administered 2014-11-29: 13:00:00 via INTRAVENOUS
  Filled 2014-11-29 (×2): qty 1000

## 2014-11-29 MED ORDER — ACETAMINOPHEN 325 MG PO TABS: 650.0000 mg | ORAL_TABLET | ORAL | Status: DC | PRN

## 2014-11-29 MED ORDER — 0.9 % SODIUM CHLORIDE (POUR BTL) OPTIME
TOPICAL | Status: DC | PRN
  Administered 2014-11-29: 1000 mL

## 2014-11-29 SURGICAL SUPPLY — 36 items
ATTRACTOMAT 16X20 MAGNETIC DRP (DRAPES) ×2 IMPLANT
BENZOIN TINCTURE PRP APPL 2/3 (GAUZE/BANDAGES/DRESSINGS) ×2 IMPLANT
BLADE HEX COATED 2.75 (ELECTRODE) ×2 IMPLANT
BLADE SURG 15 STRL LF DISP TIS (BLADE) ×1 IMPLANT
BLADE SURG 15 STRL SS (BLADE) ×1
CHLORAPREP W/TINT 26ML (MISCELLANEOUS) ×2 IMPLANT
CLIP TI MEDIUM 6 (CLIP) ×6 IMPLANT
CLIP TI WIDE RED SMALL 6 (CLIP) ×10 IMPLANT
DISSECTOR ROUND CHERRY 3/8 STR (MISCELLANEOUS) IMPLANT
DRAPE LAPAROTOMY T 98X78 PEDS (DRAPES) ×2 IMPLANT
DRESSING SURGICEL FIBRLLR 1X2 (HEMOSTASIS) ×1 IMPLANT
DRSG SURGICEL FIBRILLAR 1X2 (HEMOSTASIS) ×2
ELECT REM PT RETURN 9FT ADLT (ELECTROSURGICAL) ×2
ELECTRODE REM PT RTRN 9FT ADLT (ELECTROSURGICAL) ×1 IMPLANT
GAUZE SPONGE 4X4 12PLY STRL (GAUZE/BANDAGES/DRESSINGS) ×2 IMPLANT
GAUZE SPONGE 4X4 16PLY XRAY LF (GAUZE/BANDAGES/DRESSINGS) ×2 IMPLANT
GLOVE SURG ORTHO 8.0 STRL STRW (GLOVE) ×2 IMPLANT
GOWN STRL REUS W/TWL XL LVL3 (GOWN DISPOSABLE) ×8 IMPLANT
KIT BASIN OR (CUSTOM PROCEDURE TRAY) ×2 IMPLANT
LIQUID BAND (GAUZE/BANDAGES/DRESSINGS) IMPLANT
PACK BASIC VI WITH GOWN DISP (CUSTOM PROCEDURE TRAY) ×2 IMPLANT
PENCIL BUTTON HOLSTER BLD 10FT (ELECTRODE) ×2 IMPLANT
SHEARS HARMONIC 9CM CVD (BLADE) ×2 IMPLANT
STAPLER VISISTAT 35W (STAPLE) IMPLANT
STRIP CLOSURE SKIN 1/2X4 (GAUZE/BANDAGES/DRESSINGS) ×2 IMPLANT
SUT MNCRL AB 4-0 PS2 18 (SUTURE) ×2 IMPLANT
SUT SILK 2 0 (SUTURE)
SUT SILK 2-0 18XBRD TIE 12 (SUTURE) IMPLANT
SUT SILK 3 0 (SUTURE)
SUT SILK 3-0 18XBRD TIE 12 (SUTURE) IMPLANT
SUT VIC AB 3-0 SH 18 (SUTURE) ×4 IMPLANT
SYR BULB IRRIGATION 50ML (SYRINGE) ×2 IMPLANT
TAPE CLOTH SURG 4X10 WHT LF (GAUZE/BANDAGES/DRESSINGS) ×2 IMPLANT
TOWEL OR 17X26 10 PK STRL BLUE (TOWEL DISPOSABLE) ×2 IMPLANT
TOWEL OR NON WOVEN STRL DISP B (DISPOSABLE) ×2 IMPLANT
YANKAUER SUCT BULB TIP 10FT TU (MISCELLANEOUS) ×2 IMPLANT

## 2014-11-29 NOTE — H&P (Signed)
General Surgery [facility]  Rasheka A. Pavey DOB: [birth year] Married / Language: English / Race: Black or African American Female  History of Present Illness  The patient is a 49 year old female who presents with thyroid cancer. Patient is referred by Dr. Philemon Kingdom for newly diagnosed papillary thyroid carcinoma. Patient first noted an abnormality in the right neck in the fall of 2015. She presented to her primary care physician and underwent a thyroid ultrasound at [facility] on June 26, 2014. This showed a 3.7 x 4.1 x 1.9 cm complex vascular mass in the right thyroid lobe. Patient was referred to endocrinology for further evaluation. TSH level was normal at 0.45. Ultrasound-guided fine-needle aspiration biopsy was obtained on October 15, 2014. This shows findings consistent with papillary thyroid carcinoma, Bethesda category VI. Patient is now referred for thyroidectomy for management of papillary thyroid carcinoma. Patient has no prior history of head or neck surgery. She has never been on thyroid medication. She is accompanied by her daughter who did undergo thyroidectomy approximately 5 or 6 years ago for thyroid carcinoma. The daughter received radioactive iodine treatment. There is no other family history of endocrine neoplasm.   Other Problems Arthritis  Diagnostic Studies History Colonoscopy within last year Mammogram 1-3 years ago  Allergies No Known Drug Allergies04/01/2015  Medication History Multi-Minerals (Oral) Active. Medications Reconciled  Social History Caffeine use Carbonated beverages, Tea. No alcohol use No drug use Tobacco use Current every day smoker.  Family History Colon Polyps Family Members In General. Diabetes Mellitus Brother, Mother. Heart Disease Brother, Father, Mother. Heart disease in female family member before age 64 Hypertension Brother, Father, Mother, Sister. Thyroid  problems Daughter.  Pregnancy / Birth History Age at menarche 28 years. Age of menopause <43 Gravida 3 Maternal age 19-20 Para 2 Regular periods  Review of Systems General Present- Night Sweats and Weight Gain. Not Present- Appetite Loss, Chills, Fatigue, Fever and Weight Loss. Skin Not Present- Change in Wart/Mole, Dryness, Hives, Jaundice, New Lesions, Non-Healing Wounds, Rash and Ulcer. HEENT Not Present- Earache, Hearing Loss, Hoarseness, Nose Bleed, Oral Ulcers, Ringing in the Ears, Seasonal Allergies, Sinus Pain, Sore Throat, Visual Disturbances, Wears glasses/contact lenses and Yellow Eyes. Respiratory Not Present- Bloody sputum, Chronic Cough, Difficulty Breathing, Snoring and Wheezing. Breast Not Present- Breast Mass, Breast Pain, Nipple Discharge and Skin Changes. Cardiovascular Not Present- Chest Pain, Difficulty Breathing Lying Down, Leg Cramps, Palpitations, Rapid Heart Rate, Shortness of Breath and Swelling of Extremities. Gastrointestinal Not Present- Abdominal Pain, Bloating, Bloody Stool, Change in Bowel Habits, Chronic diarrhea, Constipation, Difficulty Swallowing, Excessive gas, Gets full quickly at meals, Hemorrhoids, Indigestion, Nausea, Rectal Pain and Vomiting. Female Genitourinary Not Present- Frequency, Nocturia, Painful Urination, Pelvic Pain and Urgency. Musculoskeletal Present- Back Pain and Joint Stiffness. Not Present- Joint Pain, Muscle Pain, Muscle Weakness and Swelling of Extremities. Neurological Not Present- Decreased Memory, Fainting, Headaches, Numbness, Seizures, Tingling, Tremor, Trouble walking and Weakness. Psychiatric Not Present- Anxiety, Bipolar, Change in Sleep Pattern, Depression, Fearful and Frequent crying. Endocrine Present- Hair Changes and Hot flashes. Not Present- Cold Intolerance, Excessive Hunger, Heat Intolerance and New Diabetes. Hematology Not Present- Easy Bruising, Excessive bleeding, Gland problems, HIV and Persistent  Infections.   Vitals 11/07/2014 10:36 AM Weight: 185 lb Height: 63in Body Surface Area: 1.93 m Body Mass Index: 32.77 kg/m Temp.: 97.62F(Oral)  Pulse: 65 (Regular)  Resp.: 14 (Unlabored)  BP: 132/68 (Sitting, Left Arm, Standard)    Physical Exam  General - appears comfortable, no distress;  not diaphorectic  HEENT - normocephalic; sclerae clear, gaze conjugate; mucous membranes moist, dentition good; voice normal  Neck - symmetric on extension; no palpable anterior or posterior cervical adenopathy; dominant smooth mass mid and inferior portion of right thyroid lobe, mobile with swallowing, nontender; left thyroid lobe without palpable abnormality  Chest - clear bilaterally without rhonchi, rales, or wheeze  Cor - regular rhythm with normal rate; no significant murmur  Ext - non-tender without significant edema or lymphedema  Neuro - grossly intact; no tremor    Assessment & Plan  PAPILLARY THYROID CARCINOMA (193  C73)  Patient presents with papillary thyroid carcinoma, 4.1 cm, involving the right thyroid lobe. She will require total thyroidectomy with limited lymph node dissection. I have discussed this at length with the patient and her daughter. We have discussed the risk and benefits of the procedure including the risk of recurrent laryngeal nerve injury and injury to parathyroid glands. We discussed the hospital stay to be anticipated. We discussed the location of the surgical incision. We have discussed the need for lifelong thyroid hormone replacement. We have also discussed the likely need for radioactive iodine treatment for 6 weeks following her procedure. Patient and her daughter understand and wish to proceed. We will make arrangements for surgery in the near future.  The risks and benefits of the procedure have been discussed at length with the patient. The patient understands the proposed procedure, potential alternative treatments, and the course of  recovery to be expected. All of the patient's questions have been answered at this time. The patient wishes to proceed with surgery.  Earnstine Regal, MD, [facility] Office: [phone]

## 2014-11-29 NOTE — Brief Op Note (Signed)
11/29/2014  9:08 AM  PATIENT:  Melinda Vazquez  49 y.o. female  PRE-OPERATIVE DIAGNOSIS:  PAPILLARY THYROID CARCINOMA  POST-OPERATIVE DIAGNOSIS:  PAPILLARY THYROID CARCINOMA  PROCEDURE:  Procedure(s): TOTAL THYROIDECTOMY LIMITED LYMPH NODE DISSECTION (N/A) LIMITED LYMPH NODE DISSECTION (N/A)  SURGEON:  Surgeon(s) and Role:    * Armandina Gemma, MD - Primary  ANESTHESIA:   general  EBL:     BLOOD ADMINISTERED:none  DRAINS: none   LOCAL MEDICATIONS USED:  NONE  SPECIMEN:  Excision  DISPOSITION OF SPECIMEN:  PATHOLOGY  COUNTS:  YES  TOURNIQUET:  * No tourniquets in log *  DICTATION: .Other Dictation: Dictation Number [id number]  PLAN OF CARE: Admit for overnight observation  PATIENT DISPOSITION:  PACU - hemodynamically stable.   Delay start of Pharmacological VTE agent (>24hrs) due to surgical blood loss or risk of bleeding: yes  Earnstine Regal, MD, [facility] Office: [phone]

## 2014-11-29 NOTE — Anesthesia Procedure Notes (Signed)
Procedure Name: Intubation Date/Time: 11/29/2014 7:20 AM Performed by: Lind Covert Pre-anesthesia Checklist: Patient identified, Emergency Drugs available, Suction available, Patient being monitored and Timeout performed Patient Re-evaluated:Patient Re-evaluated prior to inductionOxygen Delivery Method: Circle system utilized Preoxygenation: Pre-oxygenation with 100% oxygen Intubation Type: IV induction Laryngoscope Size: Mac and 4 Grade View: Grade I Tube type: Oral Number of attempts: 1 Airway Equipment and Method: Stylet Placement Confirmation: ETT inserted through vocal cords under direct vision,  positive ETCO2 and breath sounds checked- equal and bilateral Secured at: 20 cm Tube secured with: Tape Dental Injury: Teeth and Oropharynx as per pre-operative assessment

## 2014-11-29 NOTE — Anesthesia Preprocedure Evaluation (Addendum)
Anesthesia Evaluation  Patient identified by MRN, date of birth, ID band Patient awake    Reviewed: Allergy & Precautions, NPO status , Patient's Chart, lab work & pertinent test results  Airway Mallampati: II  TM Distance: >3 FB Neck ROM: Full    Dental no notable dental hx.    Pulmonary neg pulmonary ROS, Current Smoker,  breath sounds clear to auscultation  Pulmonary exam normal       Cardiovascular negative cardio ROS  Rhythm:Regular Rate:Normal     Neuro/Psych negative neurological ROS  negative psych ROS   GI/Hepatic negative GI ROS, Neg liver ROS,   Endo/Other  negative endocrine ROS  Renal/GU negative Renal ROS  negative genitourinary   Musculoskeletal negative musculoskeletal ROS (+)   Abdominal   Peds negative pediatric ROS (+)  Hematology negative hematology ROS (+)   Anesthesia Other Findings   Reproductive/Obstetrics negative OB ROS                            Anesthesia Physical Anesthesia Plan  ASA: II  Anesthesia Plan: General   Post-op Pain Management:    Induction: Intravenous  Airway Management Planned: Oral ETT  Additional Equipment:   Intra-op Plan:   Post-operative Plan: Extubation in OR  Informed Consent: I have reviewed the patients History and Physical, chart, labs and discussed the procedure including the risks, benefits and alternatives for the proposed anesthesia with the patient or authorized representative who has indicated his/her understanding and acceptance.   Dental advisory given  Plan Discussed with: CRNA  Anesthesia Plan Comments:         Anesthesia Quick Evaluation  

## 2014-11-29 NOTE — Anesthesia Postprocedure Evaluation (Signed)
  Anesthesia Post-op Note  Patient: Melinda Vazquez  Procedure(s) Performed: Procedure(s) (LRB): TOTAL THYROIDECTOMY LIMITED LYMPH NODE DISSECTION (N/A) LIMITED LYMPH NODE DISSECTION (N/A)  Patient Location: PACU  Anesthesia Type: General  Level of Consciousness: awake and alert   Airway and Oxygen Therapy: Patient Spontanous Breathing  Post-op Pain: mild  Post-op Assessment: Post-op Vital signs reviewed, Patient's Cardiovascular Status Stable, Respiratory Function Stable, Patent Airway and No signs of Nausea or vomiting  Last Vitals:  Filed Vitals:   11/29/14 1130  BP: 123/78  Pulse: 61  Temp: 37.2 C  Resp: 14    Post-op Vital Signs: stable   Complications: No apparent anesthesia complications

## 2014-11-29 NOTE — Transfer of Care (Signed)
Immediate Anesthesia Transfer of Care Note  Patient: Melinda Vazquez  Procedure(s) Performed: Procedure(s): TOTAL THYROIDECTOMY LIMITED LYMPH NODE DISSECTION (N/A) LIMITED LYMPH NODE DISSECTION (N/A)  Patient Location: PACU  Anesthesia Type:General  Level of Consciousness: sedated  Airway & Oxygen Therapy: Patient Spontanous Breathing and Patient connected to face mask oxygen  Post-op Assessment: Report given to RN and Post -op Vital signs reviewed and stable  Post vital signs: Reviewed and stable  Last Vitals:  Filed Vitals:   11/29/14 0530  BP: 156/98  Pulse: 71  Temp: 36.3 C  Resp: 18    Complications: No apparent anesthesia complications

## 2014-11-29 NOTE — Discharge Instructions (Signed)
[facility] ° °THYROID & PARATHYROID SURGERY:  POST-OP INSTRUCTIONS ° °Always review your discharge instruction sheet from the facility where your surgery was performed. ° °A prescription for pain medication may be given to you upon discharge.  Take your pain medication as prescribed.  If narcotic pain medicine is not needed, then you may take acetaminophen (Tylenol) or ibuprofen (Advil) as needed. ° °Take your usually prescribed medications unless otherwise directed. ° °If you need a refill on your pain medication, please contact your pharmacy. They will contact our office to request authorization.  Prescriptions will not be processed by our office after 5 pm or on weekends. ° °Start with a light diet upon arrival home, such as soup and crackers or toast.  Be sure to drink plenty of fluids daily.  Resume your normal diet the day after surgery. ° °Most patients will experience some swelling and bruising on the chest and neck area.  Ice packs will help.  Swelling and bruising can take several days to resolve.  ° °It is common to experience some constipation after surgery.  Increasing fluid intake and taking a stool softener will usually help or prevent this problem.  A mild laxative (Milk of Magnesia or Miralax) should be taken according to package directions if there has been no bowel movement after 48 hours. ° °You have steri-strips and a gauze dressing over your incision.  You may remove the gauze bandage on the second day after surgery, and you may shower at that time.  Leave your steri-strips (small skin tapes) in place directly over the incision.  These strips should remain on the skin for 5-7 days and then be removed.  You may get them wet in the shower and pat them dry. ° °You may resume regular (light) daily activities beginning the next day - such as daily self-care, walking, climbing stairs - gradually increasing activities as tolerated.  You may have sexual intercourse when it is  comfortable.  Refrain from any heavy lifting or straining until approved by your doctor.  You may drive when you no longer are taking prescription pain medication, you can comfortably wear a seatbelt, and you can safely maneuver your car and apply brakes. ° °You should see your doctor in the office for a follow-up appointment approximately two to three weeks after your surgery.  Make sure that you call for this appointment within a day or two after you arrive home to insure a convenient appointment time. ° °WHEN TO CALL YOUR DOCTOR: °-- Fever greater than 101.5 °-- Inability to urinate °-- Nausea and/or vomiting - persistent °-- Extreme swelling or bruising °-- Continued bleeding from incision °-- Increased pain, redness, or drainage from the incision °-- Difficulty swallowing or breathing °-- Muscle cramping or spasms °-- Numbness or tingling in hands or around lips ° °The clinic staff is available to answer your questions during regular business hours.  Please don’t hesitate to call and ask to speak to one of the nurses if you have concerns. ° °Faysal Fenoglio M. Shanna Strength, MD, FACS °General & Endocrine Surgery °[facility] °Office: [phone] ° °Website: [email or web address] ° ° °

## 2014-11-30 DIAGNOSIS — C73 Malignant neoplasm of thyroid gland: Secondary | ICD-10-CM | POA: Diagnosis not present

## 2014-11-30 LAB — BASIC METABOLIC PANEL
Anion gap: 9 (ref 5–15)
BUN: 14 mg/dL (ref 6–23)
CALCIUM: 9.9 mg/dL (ref 8.4–10.5)
CO2: 27 mmol/L (ref 19–32)
CREATININE: 1.06 mg/dL (ref 0.50–1.10)
Chloride: 106 mmol/L (ref 96–112)
GFR calc Af Amer: 69 mL/min — ABNORMAL LOW (ref >90)
GFR calc non Af Amer: 60 mL/min — ABNORMAL LOW (ref >90)
Glucose, Bld: 102 mg/dL — ABNORMAL HIGH (ref 70–99)
Potassium: 3.8 mmol/L (ref 3.5–5.1)
Sodium: 142 mmol/L (ref 135–145)

## 2014-11-30 NOTE — Care Management (Signed)
Utilization Review completed.  

## 2014-11-30 NOTE — Discharge Summary (Signed)
Physician Discharge Summary  Patient ID:  Melinda Vazquez  MRN: [id number]  DOB/AGE: 11-14-1965 49 y.o.  Admit date: 11/29/2014 Discharge date: 11/30/2014  Discharge Diagnoses:   Principal Problem:   Papillary thyroid carcinoma   Operation: Procedure(s): TOTAL THYROIDECTOMY LIMITED LYMPH NODE DISSECTION on 11/29/2014 - T. Gerkin  Discharged Condition: good  Hospital Course: Melinda Vazquez is an 49 y.o. female whose primary care physician is Secundino Ginger, PA-C and who was admitted 11/29/2014 with a chief complaint of thyroid cancer.  She sees Dr. Wardell Heath for endocronolgy and she evaluated a right thyroid nodule.    She was brought to the operating room on 11/29/2014 and underwent  TOTAL THYROIDECTOMY LIMITED LYMPH NODE DISSECTION.  She is now one day post op.  Her voice is good.  Her Ca+ is 9.9. She is ready for discharge.  The discharge instructions were reviewed with the patient.  Consults: None  Significant Diagnostic Studies: Results for orders placed or performed during the hospital encounter of 32/44/01  Basic metabolic panel  Result Value Ref Range   Sodium 142 135 - 145 mmol/L   Potassium 3.8 3.5 - 5.1 mmol/L   Chloride 106 96 - 112 mmol/L   CO2 27 19 - 32 mmol/L   Glucose, Bld 102 (H) 70 - 99 mg/dL   BUN 14 6 - 23 mg/dL   Creatinine, Ser 1.06 0.50 - 1.10 mg/dL   Calcium 9.9 8.4 - 10.5 mg/dL   GFR calc non Af Amer 60 (L) >90 mL/min   GFR calc Af Amer 69 (L) >90 mL/min   Anion gap 9 5 - 15    Dg Chest 2 View  11/25/2014   CLINICAL DATA:  Preop thyroidectomy.  Smoker.  EXAM: CHEST  2 VIEW  COMPARISON:  None.  FINDINGS: The heart size and mediastinal contours are within normal limits. Both lungs are clear. The visualized skeletal structures are unremarkable.  IMPRESSION: No active cardiopulmonary disease.   Electronically Signed   By: Rolm Baptise M.D.   On: 11/25/2014 16:11    Discharge Exam:  Filed Vitals:   11/30/14 0949  BP: 123/78  Pulse: 80  Temp: 98.4 F (36.9  C)  Resp: 18    General: WN AA F who is alert and generally healthy appearing.  Lungs: Clear to auscultation and symmetric breath sounds. Heart:  RRR. No murmur or rub. Neck:  Incision looks good.  Discharge Medications:     Medication List    TAKE these medications        oxyCODONE 5 MG immediate release tablet  Commonly known as:  Oxy IR/ROXICODONE  Take 1-2 tablets (5-10 mg total) by mouth every 6 (six) hours as needed for moderate pain.     SYNTHROID 100 MCG tablet  Generic drug:  levothyroxine  Take 1 tablet (100 mcg total) by mouth daily.      ASK your doctor about these medications        acetaminophen 500 MG tablet  Commonly known as:  TYLENOL  Take 1,000 mg by mouth every 6 (six) hours as needed.     ciprofloxacin 250 MG tablet  Commonly known as:  CIPRO  Take 1 tablet (250 mg total) by mouth every 12 (twelve) hours.     Coenzyme Q10-Red Yeast Rice 60-600 MG Caps  Take 1 tablet by mouth 2 (two) times daily.     HYDROcodone-acetaminophen 5-325 MG per tablet  Commonly known as:  NORCO/VICODIN  Take 2 tablets by mouth every 6 (six)  hours as needed for pain.     ibuprofen 200 MG tablet  Commonly known as:  ADVIL,MOTRIN  Take 200-400 mg by mouth every 6 (six) hours as needed for headache or moderate pain.     multivitamin with minerals Tabs tablet  Take 1 tablet by mouth every morning.     OVER THE COUNTER MEDICATION  Take 10 mLs by mouth every morning. Braggs Vinegar     OVER THE COUNTER MEDICATION  Take 1 tablet by mouth daily. Bone strength vitamin     OVER THE COUNTER MEDICATION  Take 1 tablet by mouth 2 (two) times daily. PM Phytogen Complex     VISINE OP  Apply 1-2 drops to eye daily as needed (red dry eyes.).        Disposition: 01-Home or Self Care        Follow-up Information    Follow up with Earnstine Regal, MD. Schedule an appointment as soon as possible for a visit in 3 weeks.   Specialty:  General Surgery   Why:  For wound  re-check   Contact information:   [place] [address] [phone]       Follow up with Philemon Kingdom, MD. Schedule an appointment as soon as possible for a visit in 3 weeks.   Specialty:  Internal Medicine   Why:  for evaluation for radioactive iodine treatment   Contact information:   [address] [place] [address] [phone]        Signed: Alphonsa Overall, M.D., [facility] Office:  [phone]  11/30/2014, 10:26 AM

## 2014-11-30 NOTE — Op Note (Signed)
Melinda Vazquez, Melinda Vazquez                  ACCOUNT NO.:  [id number]  MEDICAL RECORD NO.:  [id number]  LOCATION:  4782                         FACILITY:  [facility]  PHYSICIAN:  Earnstine Regal, MD      DATE OF BIRTH:  [birth year]  DATE OF PROCEDURE:  11/29/2014                              OPERATIVE REPORT   PREOPERATIVE DIAGNOSIS:  Papillary thyroid carcinoma.  POSTOPERATIVE DIAGNOSIS:  Papillary thyroid carcinoma.  PROCEDURE:  Total thyroidectomy with limited central compartment lymph node dissection (zone VI)  SURGEON:  Earnstine Regal, MD, FACS  ANESTHESIA:  General.  ESTIMATED BLOOD LOSS:  Minimal.  PREPARATION:  ChloraPrep.  COMPLICATIONS:  None.  INDICATIONS:  The patient is a 49 year old female referred by Dr. Philemon Kingdom for a newly diagnosed papillary thyroid carcinoma.  The patient had been noted with a mass in the right neck in the fall of 2015.  Ultrasound demonstrated a 4.1 x 3.7 x 1.9 cm complex vascular mass in the right thyroid lobe.  Ultrasound-guided fine needle aspiration biopsy showed findings consistent with papillary thyroid carcinoma.  The patient now comes to Surgery for thyroidectomy.  BODY OF REPORT:  Procedure was done in OR #6 at the [facility]  The patient was brought to the operating room, placed in supine position on the operating room table.  Following administration of general anesthesia, the patient was positioned and then prepped and draped in the usual aseptic fashion.  After ascertaining that an adequate level of anesthesia had been achieved, a Kocher incision was made with a #15 blade.  Dissection was carried through subcutaneous tissues and platysma.  Hemostasis was achieved with electrocautery.  Subplatysmal flaps were developed cephalad and caudad from the thyroid notch to the sternal notch.  A Mahorner self-retaining retractor was placed for exposure.  Strap muscles were incised in the midline.  Dissection was begun on  the left.  Strap muscles were reflected laterally exposing the left thyroid lobe. Venous tributaries were divided between Ligaclips with the Harmonic scalpel.  Superior pole was dissected out and superior pole vessels divided individually between small and medium Ligaclips with the Harmonic scalpel.  Superior parathyroid gland was identified and preserved on its vascular pedicle.  Inferior venous tributaries were divided between Ligaclips.  Inferior parathyroid gland was identified and preserved.  Left lobe was rolled anteriorly and branches of the inferior thyroid artery were divided between Ligaclips.  Recurrent laryngeal nerve was identified and preserved along its course.  Ligament of Gwenlyn Found was released with the electrocautery, and the gland was mobilized onto the anterior trachea.  Isthmus was mobilized across the midline.  There was a moderate size pyramidal lobe present which extends up onto the thyroid cartilage.  This was mobilized with the electrocautery and resected en bloc with the isthmus.  Dry pack was placed in the left neck.  Next, we turned our attention to the right thyroid lobe.  Again strap muscles were reflected laterally.  There was a large nodule in the right lobe extending into the isthmus.  The right lobe was gently mobilized with blunt dissection.  Superior pole vessels were divided between small and medium Ligaclips with the Harmonic  Scalpel taking care to preserve the superior parathyroid gland.  Inferior venous tributaries were divided between Ligaclips.  Gland was rolled anteriorly.  Branches of the inferior thyroid artery were divided between small and medium Ligaclips.  The recurrent laryngeal nerve was identified and preserved. Ligament of Gwenlyn Found was released with the Harmonic Scalpel and the gland was mobilized onto the anterior trachea.  Isthmus was mobilized.  The entire thyroid gland was resected off the anterior trachea.  Suture was used to mark the  right superior pole, and the entire thyroid gland was submitted to Pathology for review.  Next, the central compartment lymph nodes extending from the right carotid artery anteriorly across the lower trachea and to the left to the carotid artery were then resected using the electrocautery and Ligaclips for hemostasis.  Care was taken to avoid the inferior parathyroid glands and recurrent laryngeal nerves.  Tissue was submitted separately to Pathology for review.  Good hemostasis was obtained throughout the wound after irrigation. Fibrillar was then placed throughout the operative field.  Strap muscles were reapproximated in the midline with interrupted 3-0 Vicryl sutures. Platysma was closed with interrupted 3-0 Vicryl sutures.  Skin was closed with a running 4-0 Monocryl subcuticular suture.  Wound was washed and dried and benzoin and Steri-Strips were applied.  Sterile dressings were applied.  The patient was awakened from anesthesia and brought to the recovery room.  The patient tolerated the procedure well.   Earnstine Regal, MD, [facility] Office: [phone]   TMG/MEDQ  D:  11/29/2014  T:  11/30/2014  Job:  [id number]  cc:   Philemon Kingdom, M.D. Fax: [phone]

## 2014-12-02 ENCOUNTER — Encounter (HOSPITAL_COMMUNITY): Payer: Self-pay | Admitting: Surgery

## 2014-12-04 ENCOUNTER — Encounter: Payer: Self-pay | Admitting: Internal Medicine

## 2014-12-04 ENCOUNTER — Ambulatory Visit (INDEPENDENT_AMBULATORY_CARE_PROVIDER_SITE_OTHER): Payer: PRIVATE HEALTH INSURANCE | Admitting: Internal Medicine

## 2014-12-04 VITALS — BP 130/88 | HR 61 | Temp 98.1°F | Resp 12 | Wt 186.0 lb

## 2014-12-04 DIAGNOSIS — E890 Postprocedural hypothyroidism: Secondary | ICD-10-CM | POA: Diagnosis not present

## 2014-12-04 DIAGNOSIS — C73 Malignant neoplasm of thyroid gland: Secondary | ICD-10-CM

## 2014-12-04 NOTE — Patient Instructions (Signed)
Take the thyroid hormone every day, with water, >30 minutes before breakfast, separated by >4 hours from acid reflux medications, calcium, iron, multivitamins.  Please come back in 2 months.

## 2014-12-04 NOTE — Progress Notes (Signed)
Patient ID: Melinda Vazquez, female   DOB: 1965/12/11, 49 y.o.   MRN: [id number]   HPI  Melinda Vazquez is a 49 y.o.-year-old female, returning for management of follicular variant of papillary thyroid cancer (new dx).  Insurance: [employer].  Reviewed and addended hx: - Pt was seen by PCP in 06/2014 and c/o feeling a neck mass >> she was sent for a thyroid U/S. - Thyroid U/S ([facility], 06/26/2014): 3.7 x 4.1 x 1.9 cm, complex, vascular. - FNA (10/15/14):  Adequacy Reason Satisfactory For Evaluation. Diagnosis THYROID, FINE NEEDLE ASPIRATION, MIDLINE INFERIOR ISTHMUS (SPECIMEN 1 OF 1, COLLECTED ON 10/15/14): MALIGNANT (BETHESDA CATEGORY VI). FINDINGS CONSISTENT WITH PAPILLARY THYROID CARCINOMA. [name] MD Pathologist, Electronic Signature (Case signed 10/16/2014) Specimen Clinical Information 3.7 x 4.1 x 1.9 cm heterogeneous hypervascular nodule inferior thyroid isthmus Source Thyroid, Fine Needle Aspiration, Midline Inferior Isthmus - Thyroidectomy (11/29/2014) - Dr Harlow Asa:   1. Thyroid, thyroidectomy, total - FOLLICULAR VARIANT OF PAPILLARY THYROID CARCINOMA, 3.7 CM, LIMITED TO THYROIDAL PARENCHYMA. - NO EVIDENCE OF ANGIOLYMPHATIC INVASION IDENTIFIED - RESECTION MARGINS, NEGATIVE FOR ATYPIA OR MALIGNANCY. 2. Lymph nodes, regional resection, central compartment lymph node VI - SEVEN LYMPH NODES, NEGATIVE FOR METASTATIC CARCINOMA (0/7) Microscopic Comment 1. THYROID Specimen: Thyroid gland Procedure: Total thyroidectomy Specimen Integrity (intact/fragmented): Intact Tumor focality: Unifocal Maximum tumor size (cm): 3.7 cm, gross measurement Tumor laterality: Right lobe, mid to inferior Histologic type (including subtype and/or unique features as applicable): Follicular variant of papillary thyroid carcinoma Tumor capsule: Not present Extrathyroidal extension: No Margins: Negative Lymph - Vascular invasion: Not identified. Capsular invasion with degree of invasion if present:  N/A Second and additional tumors: N/A Lymph nodes: # examined 7; # positive; 0 TNM code: pT2 , pN0 Non-neoplastic thyroid: Unremarkable.  She was started on Synthroid 100 mcg daily. - in am - after b'fast! - with water - no calcium - no iron - no MVI - no PPI  Last TSH: 06/26/2014: TSH 0.45  Pt c/o: - + hot flushes - no weight gain since last visit - no tremors - no palpitations - no anxiety/depression - no hyperdefecation/+ off and on constipation - no dry skin - no hair falling - no fatigue  Pt does have a FH of thyroid ds. in daughter - thyroid cancer (? Type). No h/o radiation tx to head or neck.  ROS: Constitutional: no weight gain/loss, no fatigue, + subjective hyperthermia Eyes: no blurry vision, no xerophthalmia ENT: no sore throat, no nodules palpated in throat, no dysphagia/odynophagia, no hoarseness Cardiovascular: no CP/SOB/palpitations/leg swelling Respiratory: no cough/SOB Gastrointestinal: no N/V/D/C Musculoskeletal: no muscle/+ joint aches Skin: no rashes Neurological: no tremors/numbness/tingling/dizziness  Past Medical History  Diagnosis Date  . Hyperlipidemia   . Vitamin D deficiency   . Degenerative disc disease, lumbar     L5-S1  . Osteoporosis   . Fatigue   . Acute back pain   . Acute hip pain   . Colon polyp   . Thyroid mass   . Thyroid nodule   . Anemia     hx of  . Cancer 10/2014    papillary thyroid carcinoma-radiation after surgery   Past Surgical History  Procedure Laterality Date  . Salpingoophorectomy      one side  . Endometrial ablation  2010  . Colonoscopy  01/24/14  . Biopsy thyroid Bilateral 10/2014  . Tubal ligation  2011  . Ectopic pregnancy surgery  1998  . Thyroidectomy N/A 11/29/2014    Procedure: TOTAL THYROIDECTOMY LIMITED LYMPH NODE  DISSECTION;  Surgeon: Armandina Gemma, MD;  Location: [facility];  Service: General;  Laterality: N/A;  . Lymph node dissection N/A 11/29/2014    Procedure: LIMITED LYMPH NODE  DISSECTION;  Surgeon: Armandina Gemma, MD;  Location: [facility];  Service: General;  Laterality: N/A;   History   Social History  . Marital Status: Single    Spouse Name: N/A  . Number of Children: 2   Occupational History  . Small business customer svs rep   Social History Main Topics  . Smoking status: Current Some Day Smoker  . Smokeless tobacco: Not on file  . Alcohol Use: Occasional, red wine  . Drug Use: No  . Sexual Activity: Yes    Birth Control/ Protection: Surgical   Social History Narrative   Bone Density Study: 09/06/13   Pap Smear: 12/2012   Mammogram: 09/2012      Current Outpatient Prescriptions on File Prior to Visit  Medication Sig Dispense Refill  . acetaminophen (TYLENOL) 500 MG tablet Take 1,000 mg by mouth every 6 (six) hours as needed.    . ciprofloxacin (CIPRO) 250 MG tablet Take 1 tablet (250 mg total) by mouth every 12 (twelve) hours. (Patient not taking: Reported on 09/26/2014) 10 tablet 0  . Coenzyme Q10-Red Yeast Rice 60-600 MG CAPS Take 1 tablet by mouth 2 (two) times daily.    Marland Kitchen HYDROcodone-acetaminophen (NORCO/VICODIN) 5-325 MG per tablet Take 2 tablets by mouth every 6 (six) hours as needed for pain. (Patient not taking: Reported on 11/20/2014) 20 tablet 0  . ibuprofen (ADVIL,MOTRIN) 200 MG tablet Take 200-400 mg by mouth every 6 (six) hours as needed for headache or moderate pain.    . Multiple Vitamin (MULTIVITAMIN WITH MINERALS) TABS tablet Take 1 tablet by mouth every morning.    Marland Kitchen OVER THE COUNTER MEDICATION Take 10 mLs by mouth every morning. Braggs Vinegar    . OVER THE COUNTER MEDICATION Take 1 tablet by mouth daily. Bone strength vitamin    . OVER THE COUNTER MEDICATION Take 1 tablet by mouth 2 (two) times daily. PM Phytogen Complex    . oxyCODONE (OXY IR/ROXICODONE) 5 MG immediate release tablet Take 1-2 tablets (5-10 mg total) by mouth every 6 (six) hours as needed for moderate pain. 20 tablet 0  . SYNTHROID 100 MCG tablet Take 1 tablet (100 mcg  total) by mouth daily. 30 tablet 3  . Tetrahydrozoline HCl (VISINE OP) Apply 1-2 drops to eye daily as needed (red dry eyes.).     No current facility-administered medications on file prior to visit.   No Known Allergies Family History  Problem Relation Age of Onset  . Diabetes Mother     DM Type II   . Hyperlipidemia Mother   . Hypertension Mother   . Diabetes Father     DM Type II   . Hyperlipidemia Father   . Heart disease Father   . Hypertension Father    PE: BP 130/88 mmHg  Pulse 61  Temp(Src) 98.1 F (36.7 C) (Oral)  Resp 12  Wt 186 lb (84.369 kg)  SpO2 99% Body mass index is 32.96 kg/(m^2). Wt Readings from Last 3 Encounters:  12/04/14 186 lb (84.369 kg)  11/29/14 186 lb (84.369 kg)  09/26/14 186 lb 12.8 oz (84.732 kg)   Constitutional: overweight, in NAD Eyes: PERRLA, EOMI, no exophthalmos ENT: moist mucous membranes, thyroid scar healing - no keloid, no pain at the surgical site, no cervical lymphadenopathy Cardiovascular: RRR, No MRG Respiratory: CTA B Gastrointestinal:  abdomen soft, NT, ND, BS+ Musculoskeletal: no deformities, strength intact in all 4;  Skin: moist, warm, no rashes Neurological: no tremor with outstretched hands, DTR normal in all 4  ASSESSMENT: 1. Follicular variant of PTC  2. Postsurgical hypothyroidism   PLAN: 1. Papillary thyroid cancer - she tolerated well her recent total thyroidectomy - no swelling, pain or erythema - I had a long discussion with the patient about her recent diagnosis of papillary thyroid cancer.  reviewed the pathology:  underlined unifocality   It was not encapsulated  did not spread to the lymph vessels (0/7 LN analyzed)  discussed prognosis (very good)  discussed further treatment with RAI >> we will do this with Thyrogen, since low risk. I explained the purpose of radioactive iodine treatment to allow for further monitoring  by checking thyroglobulin   TSH goals (close to the LLN in the first year,  then more lax).  - Since we are planning to use Thyrogen for her RAI tx, she does not need to be on a low iodine diet  - We'll give her a chance to heal after her surgery and then I will see her back in 2 months, when we'll check her TFTs and plan her RAI Tx  2. Postsurgical hypothyroidism - Patient is on Synthroid 100 g daily  - She is taking the medication incorrectly, after breakfast  - We discussed at length the correct way to take the thyroid hormone every day, with water, >30 minutes before breakfast, separated by >4 hours from acid reflux medications, calcium, iron, multivitamins. - Will check her thyroid tests when she returns in 2 months

## 2014-12-19 ENCOUNTER — Telehealth: Payer: Self-pay | Admitting: Internal Medicine

## 2014-12-19 NOTE — Telephone Encounter (Signed)
Call pt about concerns for medication she is taking

## 2014-12-19 NOTE — Telephone Encounter (Signed)
Called pt and answered her questions. She wanted to know if it is ok to restart her multi vitamin. Advised pt ok as long as she has at least 4 hours between taking her Synthroid. Pt also concerned about having the RAI tx in July. Pt stated she was concerned that her job wouldn't approve of her being out for the tx. Advised her that Dr Cruzita Lederer would do FMLA paperwork if needed. Pt has FMLA due to her surgery for thyroidectomy (due to thyroid carcinoma). Pt voiced understanding.

## 2015-02-06 ENCOUNTER — Ambulatory Visit (INDEPENDENT_AMBULATORY_CARE_PROVIDER_SITE_OTHER): Payer: PRIVATE HEALTH INSURANCE | Admitting: Internal Medicine

## 2015-02-06 ENCOUNTER — Encounter: Payer: Self-pay | Admitting: Internal Medicine

## 2015-02-06 VITALS — BP 138/78 | HR 73 | Temp 97.9°F | Resp 12 | Wt 193.6 lb

## 2015-02-06 DIAGNOSIS — E890 Postprocedural hypothyroidism: Secondary | ICD-10-CM | POA: Diagnosis not present

## 2015-02-06 DIAGNOSIS — C73 Malignant neoplasm of thyroid gland: Secondary | ICD-10-CM

## 2015-02-06 LAB — TSH: TSH: 0.2 u[IU]/mL — AB (ref 0.35–4.50)

## 2015-02-06 LAB — T4, FREE: FREE T4: 0.94 ng/dL (ref 0.60–1.60)

## 2015-02-06 NOTE — Progress Notes (Signed)
Patient ID: Melinda Vazquez, female   DOB: Dec 15, 1965, 49 y.o.   MRN: [id number]   HPI  Melinda Vazquez is a 49 y.o.-year-old female, returning for management of follicular variant of papillary thyroid cancer and postsurgical hypothyroidism. Last visit 2 mo ago. Insurance: [employer].  Reviewed and addended hx: - Pt was seen by PCP in 06/2014 and c/o feeling a neck mass >> she was sent for a thyroid U/S. - Thyroid U/S ([facility], 06/26/2014): 3.7 x 4.1 x 1.9 cm, complex, vascular. - FNA (10/15/14):  Adequacy Reason Satisfactory For Evaluation. Diagnosis THYROID, FINE NEEDLE ASPIRATION, MIDLINE INFERIOR ISTHMUS (SPECIMEN 1 OF 1, COLLECTED ON 10/15/14): MALIGNANT (BETHESDA CATEGORY VI). FINDINGS CONSISTENT WITH PAPILLARY THYROID CARCINOMA. Melinda Vazquez Pathologist, Electronic Signature (Case signed 10/16/2014) Specimen Clinical Information 3.7 x 4.1 x 1.9 cm heterogeneous hypervascular nodule inferior thyroid isthmus Source Thyroid, Fine Needle Aspiration, Midline Inferior Isthmus - Thyroidectomy (11/29/2014) - Dr Harlow Asa:   1. Thyroid, thyroidectomy, total - FOLLICULAR VARIANT OF PAPILLARY THYROID CARCINOMA, 3.7 CM, LIMITED TO THYROIDAL PARENCHYMA. - NO EVIDENCE OF ANGIOLYMPHATIC INVASION IDENTIFIED - RESECTION MARGINS, NEGATIVE FOR ATYPIA OR MALIGNANCY. 2. Lymph nodes, regional resection, central compartment lymph node VI - SEVEN LYMPH NODES, NEGATIVE FOR METASTATIC CARCINOMA (0/7) Microscopic Comment 1. THYROID Specimen: Thyroid gland Procedure: Total thyroidectomy Specimen Integrity (intact/fragmented): Intact Tumor focality: Unifocal Maximum tumor size (cm): 3.7 cm, gross measurement Tumor laterality: Right lobe, mid to inferior Histologic type (including subtype and/or unique features as applicable): Follicular variant of papillary thyroid carcinoma Tumor capsule: Not present Extrathyroidal extension: No Margins: Negative Lymph - Vascular invasion: Not identified. Capsular invasion  with degree of invasion if present: N/A Second and additional tumors: N/A Lymph nodes: # examined 7; # positive; 0 TNM code: pT2 , pN0 Non-neoplastic thyroid: Unremarkable.  She was started on Synthroid 100 mcg daily. - in am - before b'fast now, at last visit she was taking it with breakfast! - with water - no calcium - no iron - no MVI - no PPI  Last TSH:   06/26/2014: TSH 0.45  Pt c/o: - + hot flushes - + weight gain - ~ 7 lbs - no tremors - no palpitations - no anxiety/depression - no hyperdefecation/ constipation - no dry skin - no hair falling - no fatigue  Pt does have a FH of thyroid ds. in daughter - thyroid cancer (? Type). No h/o radiation tx to head or neck.  ROS: Constitutional: + weight gain, no fatigue, + subjective hyperthermia Eyes: no blurry vision, no xerophthalmia ENT: no sore throat, no nodules palpated in throat, no dysphagia/odynophagia, no hoarseness Cardiovascular: no CP/SOB/palpitations/leg swelling Respiratory: no cough/SOB Gastrointestinal: no N/V/D/C Musculoskeletal: no muscle/+ joint aches (hip) Skin: no rashes Neurological: no tremors/numbness/tingling/dizziness  I reviewed pt's medications, allergies, PMH, social hx, family hx, and changes were documented in the history of present illness. Otherwise, unchanged from my initial visit note:  Past Medical History  Diagnosis Date  . Hyperlipidemia   . Vitamin D deficiency   . Degenerative disc disease, lumbar     L5-S1  . Osteoporosis   . Fatigue   . Acute back pain   . Acute hip pain   . Colon polyp   . Thyroid mass   . Thyroid nodule   . Anemia     hx of  . Cancer 10/2014    papillary thyroid carcinoma-radiation after surgery   Past Surgical History  Procedure Laterality Date  . Salpingoophorectomy      one side  .  Endometrial ablation  2010  . Colonoscopy  01/24/14  . Biopsy thyroid Bilateral 10/2014  . Tubal ligation  2011  . Ectopic pregnancy surgery  1998  .  Thyroidectomy N/A 11/29/2014    Procedure: TOTAL THYROIDECTOMY LIMITED LYMPH NODE DISSECTION;  Surgeon: Armandina Gemma, Vazquez;  Location: [facility];  Service: General;  Laterality: N/A;  . Lymph node dissection N/A 11/29/2014    Procedure: LIMITED LYMPH NODE DISSECTION;  Surgeon: Armandina Gemma, Vazquez;  Location: [facility];  Service: General;  Laterality: N/A;   History   Social History  . Marital Status: Single    Spouse Name: N/A  . Number of Children: 2   Occupational History  . Small business customer svs rep   Social History Main Topics  . Smoking status: Current Some Day Smoker  . Smokeless tobacco: Not on file  . Alcohol Use: Occasional, red wine  . Drug Use: No  . Sexual Activity: Yes    Birth Control/ Protection: Surgical   Social History Narrative   Bone Density Study: 09/06/13   Pap Smear: 12/2012   Mammogram: 09/2012      Current Outpatient Prescriptions on File Prior to Visit  Medication Sig Dispense Refill  . ibuprofen (ADVIL,MOTRIN) 200 MG tablet Take 200-400 mg by mouth every 6 (six) hours as needed for headache or moderate pain.    . Multiple Vitamin (MULTIVITAMIN WITH MINERALS) TABS tablet Take 1 tablet by mouth every morning.    Marland Kitchen OVER THE COUNTER MEDICATION Take 10 mLs by mouth every morning. Braggs Vinegar    . OVER THE COUNTER MEDICATION Take 1 tablet by mouth daily. Bone strength vitamin    . OVER THE COUNTER MEDICATION Take 1 tablet by mouth 2 (two) times daily. PM Phytogen Complex    . SYNTHROID 100 MCG tablet Take 1 tablet (100 mcg total) by mouth daily. 30 tablet 3  . Tetrahydrozoline HCl (VISINE OP) Apply 1-2 drops to eye daily as needed (red dry eyes.).    Marland Kitchen acetaminophen (TYLENOL) 500 MG tablet Take 1,000 mg by mouth every 6 (six) hours as needed.    . ciprofloxacin (CIPRO) 250 MG tablet Take 1 tablet (250 mg total) by mouth every 12 (twelve) hours. (Patient not taking: Reported on 02/06/2015) 10 tablet 0  . Coenzyme Q10-Red Yeast Rice 60-600 MG CAPS Take 1 tablet by mouth  2 (two) times daily.    Marland Kitchen HYDROcodone-acetaminophen (NORCO/VICODIN) 5-325 MG per tablet Take 2 tablets by mouth every 6 (six) hours as needed for pain. (Patient not taking: Reported on 02/06/2015) 20 tablet 0  . oxyCODONE (OXY IR/ROXICODONE) 5 MG immediate release tablet Take 1-2 tablets (5-10 mg total) by mouth every 6 (six) hours as needed for moderate pain. (Patient not taking: Reported on 02/06/2015) 20 tablet 0   No current facility-administered medications on file prior to visit.   No Known Allergies Family History  Problem Relation Age of Onset  . Diabetes Mother     DM Type II   . Hyperlipidemia Mother   . Hypertension Mother   . Diabetes Father     DM Type II   . Hyperlipidemia Father   . Heart disease Father   . Hypertension Father    PE: BP 138/78 mmHg  Pulse 73  Temp(Src) 97.9 F (36.6 C) (Oral)  Resp 12  Wt 193 lb 9.6 oz (87.816 kg)  SpO2 98% Body mass index is 34.3 kg/(m^2). Wt Readings from Last 3 Encounters:  02/06/15 193 lb 9.6 oz (87.816 kg)  12/04/14 186 lb (84.369 kg)  11/29/14 186 lb (84.369 kg)   Constitutional: overweight, in NAD Eyes: PERRLA, EOMI, no exophthalmos ENT: moist mucous membranes, thyroid scar healing - no keloid, no pain at the surgical site, no cervical lymphadenopathy Cardiovascular: RRR, No MRG Respiratory: CTA B Gastrointestinal: abdomen soft, NT, ND, BS+ Musculoskeletal: no deformities, strength intact in all 4;  Skin: moist, warm, no rashes Neurological: no tremor with outstretched hands, DTR normal in all 4  ASSESSMENT: 1. Follicular variant of PTC  2. Postsurgical hypothyroidism   PLAN: 1. Papillary thyroid cancer - no swelling, pain or erythema at surgical site. She has a little numbness in submental area - We reviewed her PTC features:  reviewed the pathology:  underlined unifocality   It was not encapsulated  did not spread to the lymph vessels (0/7 LN analyzed)  discussed prognosis (very good)  discussed  further treatment with RAI >> we will do this with Thyrogen, since low risk. I again explained the purpose of radioactive iodine treatment to allow for further monitoring  by checking thyroglobulin   TSH goals (close to the LLN in the first year, then more lax).  - Since we are planning to use Thyrogen for her RAI tx, she does not need to be on a low iodine diet >> ordered the tx - check TFTs today  2. Postsurgical hypothyroidism - Patient is on Synthroid 100 g daily  - She is taking the medication correctly now, before breakfast  - We discussed at length the correct way to take the thyroid hormone every day, with water, >30 minutes before breakfast, separated by >4 hours from acid reflux medications, calcium, iron, multivitamins. - Will check her thyroid tests now and ask her to return in 4 months  Office Visit on 02/06/2015  Component Date Value Ref Range Status  . TSH 02/06/2015 0.20* 0.35 - 4.50 uIU/mL Final  . Free T4 02/06/2015 0.94  0.60 - 1.60 ng/dL Final   TSH slightly lower than normal, but this is desirable in the first year after her surgery. She is asymptomatic. We'll continue Synthroid 100 g daily. We'll recheck her thyroid tests at next visit.

## 2015-02-06 NOTE — Patient Instructions (Addendum)
Please stop at the lab.  Please come back for a follow-up appointment in 4 months.  You will be called with the schedule for the radioactive iodine treatment.  Continue to take the thyroid hormone every day, with water, >30 minutes before breakfast, separated by >4 hours from acid reflux medications, calcium, iron, multivitamins.

## 2015-02-12 ENCOUNTER — Ambulatory Visit: Payer: PRIVATE HEALTH INSURANCE | Admitting: Internal Medicine

## 2015-02-12 ENCOUNTER — Telehealth: Payer: Self-pay | Admitting: Internal Medicine

## 2015-02-12 NOTE — Telephone Encounter (Signed)
Yes, but this is per PCP. When does she have the Thyrogen-stimulated WBS scheduled?

## 2015-02-12 NOTE — Telephone Encounter (Signed)
The Thyrogen/RAI has been scheduled for 7/27, 28 and 29. Do I need to advise the pt to contact her PCP to have them to order the mammogram? Please advise.

## 2015-02-12 NOTE — Telephone Encounter (Signed)
Pt called and wants to know if she can get a mammogram before her apt, please call pt back @ [phone]

## 2015-02-12 NOTE — Telephone Encounter (Signed)
Please read message below and advise.  

## 2015-02-13 NOTE — Telephone Encounter (Signed)
Yes

## 2015-02-13 NOTE — Telephone Encounter (Signed)
Advised pt. She voiced understanding.  

## 2015-02-19 ENCOUNTER — Telehealth: Payer: Self-pay | Admitting: Internal Medicine

## 2015-02-19 NOTE — Telephone Encounter (Signed)
Pt needs to change the dates of her body scan and RAI

## 2015-02-26 ENCOUNTER — Ambulatory Visit (HOSPITAL_COMMUNITY): Payer: PRIVATE HEALTH INSURANCE

## 2015-02-27 ENCOUNTER — Ambulatory Visit (HOSPITAL_COMMUNITY): Payer: PRIVATE HEALTH INSURANCE

## 2015-02-28 ENCOUNTER — Encounter (HOSPITAL_COMMUNITY): Payer: PRIVATE HEALTH INSURANCE

## 2015-03-07 ENCOUNTER — Telehealth: Payer: Self-pay | Admitting: Internal Medicine

## 2015-03-07 NOTE — Telephone Encounter (Signed)
Pt is asking the status of the FMLA

## 2015-03-07 NOTE — Telephone Encounter (Signed)
Called pt and advised her. Pt voiced understanding.

## 2015-03-07 NOTE — Telephone Encounter (Signed)
Please read message below. I have not seen FMLA paperwork for this pt. Please advise.

## 2015-03-07 NOTE — Telephone Encounter (Signed)
Please have her call Dr Gala Lewandowsky office >> he did this for her per last note he sent me.

## 2015-03-10 ENCOUNTER — Telehealth: Payer: Self-pay | Admitting: Internal Medicine

## 2015-03-10 NOTE — Telephone Encounter (Signed)
FMLA paperwork is supposed to be filled out by [place] since we are the ones taking her out of work. Dr. Harlow Asa has not received any info on the pt.  Please call with status of the paperwork

## 2015-03-10 NOTE — Telephone Encounter (Signed)
Melinda Vazquez, Look in the scanned notes under Media, in last OV note from Dr Harlow Asa. He mentioned in there that he extended the leave from work by 3 weeks... I do not have the form. Why does she still need the FMLA?

## 2015-03-10 NOTE — Telephone Encounter (Signed)
Called pt and lvm pt a voice message. Pt returned call. Pt stated the FMLA paperwork is for her Rai tx coming up on the 8/10 through 8/12. Pt will need to be out of work for this treatment. Pt verified fax# and will have her ins co to fax the form to Dr Cruzita Lederer. Be advised.

## 2015-03-10 NOTE — Telephone Encounter (Signed)
OK, no problem 

## 2015-03-10 NOTE — Telephone Encounter (Signed)
Please read message below and advise.  

## 2015-03-12 ENCOUNTER — Encounter (HOSPITAL_COMMUNITY)
Admission: RE | Admit: 2015-03-12 | Discharge: 2015-03-12 | Disposition: A | Discharge status: Home / Self Care | Payer: 59 | Source: Ambulatory Visit | Attending: Internal Medicine | Admitting: Internal Medicine

## 2015-03-12 ENCOUNTER — Other Ambulatory Visit: Payer: Self-pay | Admitting: Endocrinology

## 2015-03-12 ENCOUNTER — Other Ambulatory Visit: Payer: Self-pay | Admitting: Internal Medicine

## 2015-03-12 DIAGNOSIS — C73 Malignant neoplasm of thyroid gland: Secondary | ICD-10-CM

## 2015-03-12 MED ORDER — THYROTROPIN ALFA 1.1 MG IM SOLR
0.9000 mg | INTRAMUSCULAR | Status: AC
  Administered 2015-03-12: 0.9 mg via INTRAMUSCULAR

## 2015-03-13 ENCOUNTER — Encounter (HOSPITAL_COMMUNITY)
Admission: RE | Admit: 2015-03-13 | Discharge: 2015-03-13 | Disposition: A | Discharge status: Home / Self Care | Payer: 59 | Source: Ambulatory Visit | Attending: Internal Medicine | Admitting: Internal Medicine

## 2015-03-13 DIAGNOSIS — C73 Malignant neoplasm of thyroid gland: Secondary | ICD-10-CM | POA: Diagnosis not present

## 2015-03-13 MED ORDER — THYROTROPIN ALFA 1.1 MG IM SOLR
0.9000 mg | INTRAMUSCULAR | Status: AC
  Administered 2015-03-13: 0.9 mg via INTRAMUSCULAR

## 2015-03-14 ENCOUNTER — Encounter (HOSPITAL_COMMUNITY)
Admission: RE | Admit: 2015-03-14 | Discharge: 2015-03-14 | Disposition: A | Discharge status: Home / Self Care | Payer: 59 | Source: Ambulatory Visit | Attending: Internal Medicine | Admitting: Internal Medicine

## 2015-03-14 DIAGNOSIS — C73 Malignant neoplasm of thyroid gland: Secondary | ICD-10-CM | POA: Diagnosis not present

## 2015-03-14 LAB — HCG, SERUM, QUALITATIVE: Preg, Serum: NEGATIVE

## 2015-03-14 MED ORDER — SODIUM IODIDE I 131 CAPSULE
69.2000 | Freq: Once | INTRAVENOUS | Status: AC | PRN
  Administered 2015-03-14: 69.2 via ORAL

## 2015-03-17 ENCOUNTER — Telehealth: Payer: Self-pay | Admitting: Internal Medicine

## 2015-03-17 NOTE — Telephone Encounter (Signed)
Called pt and advised her that we never received the forms from her office. Pt to contact HR and to come by with form for Dr Lilia Argue to complete.

## 2015-03-17 NOTE — Telephone Encounter (Signed)
Patient called stating that she needs Dr. Cruzita Vazquez to fill out some forms for her return to work  I advised patient that we are to give up to 3 business days to complete  Melinda Vazquez states it needs to be completed today or Dr. Cruzita Vazquez will have to extend her leave    Please advise patient    Thank you

## 2015-03-24 ENCOUNTER — Encounter (HOSPITAL_COMMUNITY)
Admission: RE | Admit: 2015-03-24 | Discharge: 2015-03-24 | Disposition: A | Discharge status: Home / Self Care | Payer: 59 | Source: Ambulatory Visit | Attending: Endocrinology | Admitting: Endocrinology

## 2015-03-24 DIAGNOSIS — C73 Malignant neoplasm of thyroid gland: Secondary | ICD-10-CM | POA: Diagnosis not present

## 2015-03-24 DIAGNOSIS — Z9889 Other specified postprocedural states: Secondary | ICD-10-CM | POA: Diagnosis not present

## 2015-03-25 ENCOUNTER — Telehealth: Payer: Self-pay | Admitting: Internal Medicine

## 2015-03-25 NOTE — Telephone Encounter (Signed)
Pt requesting for call back regarding FMLA paperwork and what shouldve come over yesterday, she needs a provider health form filled out for work asap to be sent back to [place]  Also, intermittent leave paper needs to be filled out and sent back asap as well

## 2015-03-25 NOTE — Telephone Encounter (Signed)
8/10-15/16 is the dates she is waiting for the approval. Please fax the forms back to [place] asap thank you

## 2015-03-25 NOTE — Telephone Encounter (Signed)
Melinda Vazquez I gave patient your voice mail, please call patient and let her know what's going on even if you are waiting on a approval please let her know that. She has called 4 times.

## 2015-03-25 NOTE — Telephone Encounter (Signed)
Called pt and lvm advising her that we did receive the form. We will read through the forms and call her back tomorrow to advise.

## 2015-03-28 NOTE — Telephone Encounter (Signed)
Called pt and advised her that we have faxed all paperwork to her ins company. Pt voiced understanding.

## 2015-04-04 ENCOUNTER — Telehealth: Payer: Self-pay | Admitting: Internal Medicine

## 2015-04-04 MED ORDER — SYNTHROID 100 MCG PO TABS: 100.0000 ug | ORAL_TABLET | Freq: Every day | ORAL | Status: DC

## 2015-04-04 NOTE — Telephone Encounter (Signed)
Called pt and she advised me per her FMLA paperwork. Dr Cruzita Lederer made the correction and we faxed to [facility] [phone].

## 2015-04-04 NOTE — Telephone Encounter (Signed)
Patient called stating that she needs a new Rx sent to her pharmacy (needs to be updated)  Rx: Synthroid  Pharmacy: [facility]   Also, please make sure Larene Beach calls her as she needs to speak with her regarding her insurance  Call back: [phone]  Thank you

## 2015-05-06 ENCOUNTER — Telehealth: Payer: Self-pay | Admitting: Internal Medicine

## 2015-05-06 NOTE — Telephone Encounter (Signed)
Patient would like for you to cal her, she stated that she feel like her iron is low, and want it checked, please advise

## 2015-05-06 NOTE — Telephone Encounter (Signed)
Please read message below and advise.  

## 2015-05-06 NOTE — Telephone Encounter (Signed)
Please contact PCP, I do not manage anemia.

## 2015-05-06 NOTE — Telephone Encounter (Signed)
Called pt and lvm advising her per Dr Gherghe's message below.  

## 2015-06-09 ENCOUNTER — Ambulatory Visit (INDEPENDENT_AMBULATORY_CARE_PROVIDER_SITE_OTHER): Payer: 59 | Admitting: Internal Medicine

## 2015-06-09 ENCOUNTER — Encounter: Payer: Self-pay | Admitting: Internal Medicine

## 2015-06-09 VITALS — BP 112/70 | HR 80 | Temp 98.0°F | Resp 12 | Wt 203.0 lb

## 2015-06-09 DIAGNOSIS — E890 Postprocedural hypothyroidism: Secondary | ICD-10-CM

## 2015-06-09 DIAGNOSIS — C73 Malignant neoplasm of thyroid gland: Secondary | ICD-10-CM | POA: Diagnosis not present

## 2015-06-09 NOTE — Progress Notes (Signed)
Patient ID: Melinda Vazquez, female   DOB: [birth year], 49 y.o.   MRN: [id number]   HPI  Melinda Vazquez is a 49 y.o.-year-old female, returning for management of follicular variant of papillary thyroid cancer and postsurgical hypothyroidism. Last visit 2 mo ago. Insurance: [employer].  Reviewed and addended hx: - Pt was seen by PCP in 06/2014 and c/o feeling a neck mass >> she was sent for a thyroid U/S. - Thyroid U/S ([facility], 06/26/2014): 3.7 x 4.1 x 1.9 cm, complex, vascular. - FNA (10/15/14):  Adequacy Reason Satisfactory For Evaluation. Diagnosis THYROID, FINE NEEDLE ASPIRATION, MIDLINE INFERIOR ISTHMUS (SPECIMEN 1 OF 1, COLLECTED ON 10/15/14): MALIGNANT (BETHESDA CATEGORY VI). FINDINGS CONSISTENT WITH PAPILLARY THYROID CARCINOMA. Melinda Laws MD Pathologist, Electronic Signature (Case signed 10/16/2014) Specimen Clinical Information 3.7 x 4.1 x 1.9 cm heterogeneous hypervascular nodule inferior thyroid isthmus Source Thyroid, Fine Needle Aspiration, Midline Inferior Isthmus - Thyroidectomy (11/29/2014) - Dr Melinda Vazquez:   1. Thyroid, thyroidectomy, total - FOLLICULAR VARIANT OF PAPILLARY THYROID CARCINOMA, 3.7 CM, LIMITED TO THYROIDAL PARENCHYMA. - NO EVIDENCE OF ANGIOLYMPHATIC INVASION IDENTIFIED - RESECTION MARGINS, NEGATIVE FOR ATYPIA OR MALIGNANCY. 2. Lymph nodes, regional resection, central compartment lymph node VI - SEVEN LYMPH NODES, NEGATIVE FOR METASTATIC CARCINOMA (0/7) Microscopic Comment 1. THYROID Specimen: Thyroid gland Procedure: Total thyroidectomy Specimen Integrity (intact/fragmented): Intact Tumor focality: Unifocal Maximum tumor size (cm): 3.7 cm, gross measurement Tumor laterality: Right lobe, mid to inferior Histologic type (including subtype and/or unique features as applicable): Follicular variant of papillary thyroid carcinoma Tumor capsule: Not present Extrathyroidal extension: No Margins: Negative Lymph - Vascular invasion: Not identified. Capsular invasion  with degree of invasion if present: N/A Second and additional tumors: N/A Lymph nodes: # examined 7; # positive; 0 TNM code: pT2 , pN0 Non-neoplastic thyroid: Unremarkable. - RAI Tx (03/17/2015): 69.2 mCi  (done with Thyrogen) - post RAI tx WBS (03/24/2015): negative, except remnant activity in thyroid bed  She was started on Synthroid 100 mcg daily. - in am - 1h before b'fast - with water - no calcium - no iron - + MVI inconsistently - 1h after LT4! - no PPI  Last TSH:  Lab Results  Component Value Date   TSH 0.20* 02/06/2015   FREET4 0.94 02/06/2015   06/26/2014: TSH 0.45  Pt c/o: - + hot flushes - + weight gain - 10 lbs - no tremors - no palpitations - no anxiety/depression - no hyperdefecation/ constipation - no dry skin - no hair falling - no fatigue  Pt does have a FH of thyroid ds. in daughter - thyroid cancer (? Type). No h/o radiation tx to head or neck.  She stopped smoking 11/2014.   ROS: Constitutional: + weight gain, no fatigue, + subjective hyperthermia Eyes: no blurry vision, no xerophthalmia ENT: no sore throat, no nodules palpated in throat, no dysphagia/odynophagia, no hoarseness Cardiovascular: no CP/SOB/palpitations/leg swelling Respiratory: no cough/SOB Gastrointestinal: no N/V/D/C Musculoskeletal: + muscle/+ joint aches (hip) Skin: no rashes Neurological: no tremors/numbness/tingling/dizziness  I reviewed pt's medications, allergies, PMH, social hx, family hx, and changes were documented in the history of present illness. Otherwise, unchanged from my initial visit note:  Past Medical History  Diagnosis Date  . Hyperlipidemia   . Vitamin D deficiency   . Degenerative disc disease, lumbar     L5-S1  . Osteoporosis   . Fatigue   . Acute back pain   . Acute hip pain   . Colon polyp   . Thyroid mass   . Thyroid nodule   .  Anemia     hx of  . Cancer ([place]) 10/2014    papillary thyroid carcinoma-radiation after surgery   Past  Surgical History  Procedure Laterality Date  . Salpingoophorectomy      one side  . Endometrial ablation  2010  . Colonoscopy  01/24/14  . Biopsy thyroid Bilateral 10/2014  . Tubal ligation  2011  . Ectopic pregnancy surgery  1998  . Thyroidectomy N/A 11/29/2014    Procedure: TOTAL THYROIDECTOMY LIMITED LYMPH NODE DISSECTION;  Surgeon: Melinda Gemma, MD;  Location: [facility];  Service: General;  Laterality: N/A;  . Lymph node dissection N/A 11/29/2014    Procedure: LIMITED LYMPH NODE DISSECTION;  Surgeon: Melinda Gemma, MD;  Location: [facility];  Service: General;  Laterality: N/A;   History   Social History  . Marital Status: Single    Spouse Name: N/A  . Number of Children: 2   Occupational History  . Small business customer svs rep   Social History Main Topics  . Smoking status: Current Some Day Smoker  . Smokeless tobacco: Not on file  . Alcohol Use: Occasional, red wine  . Drug Use: No  . Sexual Activity: Yes    Birth Control/ Protection: Surgical   Social History Narrative   Bone Density Study: 09/06/13   Pap Smear: 12/2012   Mammogram: 09/2012      Current Outpatient Prescriptions on File Prior to Visit  Medication Sig Dispense Refill  . acetaminophen (TYLENOL) 500 MG tablet Take 1,000 mg by mouth every 6 (six) hours as needed.    . Coenzyme Q10-Red Yeast Rice 60-600 MG CAPS Take 1 tablet by mouth 2 (two) times daily.    Marland Kitchen ibuprofen (ADVIL,MOTRIN) 200 MG tablet Take 200-400 mg by mouth every 6 (six) hours as needed for headache or moderate pain.    . Multiple Vitamin (MULTIVITAMIN WITH MINERALS) TABS tablet Take 1 tablet by mouth every morning.    Marland Kitchen OVER THE COUNTER MEDICATION Take 10 mLs by mouth every morning. Braggs Vinegar    . OVER THE COUNTER MEDICATION Take 1 tablet by mouth daily. Bone strength vitamin    . OVER THE COUNTER MEDICATION Take 1 tablet by mouth 2 (two) times daily. PM Phytogen Complex    . oxyCODONE (OXY IR/ROXICODONE) 5 MG immediate release tablet Take 1-2  tablets (5-10 mg total) by mouth every 6 (six) hours as needed for moderate pain. (Patient not taking: Reported on 02/06/2015) 20 tablet 0  . SYNTHROID 100 MCG tablet Take 1 tablet (100 mcg total) by mouth daily. 90 tablet 1  . Tetrahydrozoline HCl (VISINE OP) Apply 1-2 drops to eye daily as needed (red dry eyes.).     No current facility-administered medications on file prior to visit.   No Known Allergies Family History  Problem Relation Age of Onset  . Diabetes Mother     DM Type II   . Hyperlipidemia Mother   . Hypertension Mother   . Diabetes Father     DM Type II   . Hyperlipidemia Father   . Heart disease Father   . Hypertension Father    PE: BP 112/70 mmHg  Pulse 80  Temp(Src) 98 F (36.7 C) (Oral)  Resp 12  Wt 203 lb (92.08 kg)  SpO2 99% Body mass index is 35.97 kg/(m^2). Wt Readings from Last 3 Encounters:  06/09/15 203 lb (92.08 kg)  02/06/15 193 lb 9.6 oz (87.816 kg)  12/04/14 186 lb (84.369 kg)   Constitutional: overweight, in NAD Eyes:  PERRLA, EOMI, no exophthalmos ENT: moist mucous membranes, thyroid scar healing - no keloid, no pain at the surgical site, no cervical lymphadenopathy Cardiovascular: RRR, No MRG Respiratory: CTA B Gastrointestinal: abdomen soft, NT, ND, BS+ Musculoskeletal: no deformities, strength intact in all 4;  Skin: moist, warm, no rashes Neurological: no tremor with outstretched hands, DTR normal in all 4  ASSESSMENT: 1. Follicular variant of PTC  2. Postsurgical hypothyroidism   PLAN: 1. Papillary thyroid cancer - no swelling, pain or erythema at surgical site. She has a little numbness in submental area - I reviewed her PTC features:  reviewed the pathology:  unifocal  not encapsulated  did not spread to the lymph vessels (0/7 LN analyzed)  Reviewed post Tx WBS with her >> no mets  Discussed TSH goals (close to the LLN in the first year, then more lax).  - check TFTs today, along with Tg + ATA  2. Postsurgical  hypothyroidism - Patient is on Synthroid 100 g daily  - She is taking the medication correctly now, before breakfast, but takes MVI 1h after LT4 (occasionally) >> advised to separate them  by >4h) - We discussed at length the correct way to take the thyroid hormone every day, with water, >30 minutes before breakfast, separated by >4 hours from acid reflux medications, calcium, iron, multivitamins. - Will check her thyroid tests now and ask her to return in 6 months  Office Visit on 06/09/2015  Component Date Value Ref Range Status  . Thyroglobulin 06/09/2015 <0.1* 2.8 - 40.9 ng/mL Final   Comment: Thyroglobulin antibodies (TGAb) interfere with Thyroglobulin (TG) assays; therefore, Thyroglobulin antibody (TGAb) assay should always be performed in conjunction with a Thyroglobulin (TG) assay.   This test was performed using the Beckman Coulter chemiluminescent method.  Values obtained from different assay methods cannot be used interchangeably.  Thyroglobulin levels, regardless of value, should not be interpreted as absolute evidence of the presence or absence of disease.   . Thyroglobulin Ab 06/09/2015 <1  <2 IU/mL Final   Thyroglobulin and thyroglobulin antibodies are undetectable. TSH and free T4 were not drawn. We will ask the patient to come back for these.

## 2015-06-09 NOTE — Patient Instructions (Addendum)
Please stop at the lab.  Please come back for a follow-up appointment in 6 months.  Continue to take the thyroid hormone every day, with water, >30 minutes before breakfast, separated by >4 hours from acid reflux medications, calcium, iron, multivitamins.

## 2015-06-10 LAB — THYROGLOBULIN LEVEL

## 2015-06-10 LAB — THYROGLOBULIN ANTIBODY

## 2015-07-10 ENCOUNTER — Telehealth: Payer: Self-pay | Admitting: Internal Medicine

## 2015-07-10 NOTE — Telephone Encounter (Signed)
Pt made an appt for labs but would like a call back please

## 2015-07-10 NOTE — Telephone Encounter (Signed)
Returned pt's call. Lvm to return call.

## 2015-07-10 NOTE — Telephone Encounter (Signed)
Pt returned call. Answered her questions.

## 2015-07-14 ENCOUNTER — Other Ambulatory Visit (INDEPENDENT_AMBULATORY_CARE_PROVIDER_SITE_OTHER): Payer: 59

## 2015-07-14 ENCOUNTER — Other Ambulatory Visit: Payer: Self-pay | Admitting: Internal Medicine

## 2015-07-14 DIAGNOSIS — E890 Postprocedural hypothyroidism: Secondary | ICD-10-CM

## 2015-07-14 LAB — T4, FREE: Free T4: 0.78 ng/dL (ref 0.60–1.60)

## 2015-07-14 LAB — TSH: TSH: 0.4 u[IU]/mL (ref 0.35–4.50)

## 2015-08-07 ENCOUNTER — Telehealth: Payer: Self-pay | Admitting: Internal Medicine

## 2015-08-07 NOTE — Telephone Encounter (Signed)
Pt wanted a return call regarding the synthroid med

## 2015-08-11 MED ORDER — SYNTHROID 100 MCG PO TABS: 100.0000 ug | ORAL_TABLET | Freq: Every day | ORAL | Status: DC

## 2015-09-26 ENCOUNTER — Ambulatory Visit: Payer: Self-pay | Admitting: Internal Medicine

## 2015-12-02 ENCOUNTER — Other Ambulatory Visit: Payer: Self-pay | Admitting: Internal Medicine

## 2015-12-08 ENCOUNTER — Ambulatory Visit: Payer: 59 | Admitting: Internal Medicine

## 2015-12-09 ENCOUNTER — Telehealth: Payer: Self-pay | Admitting: Internal Medicine

## 2015-12-09 NOTE — Telephone Encounter (Signed)
[facility] Note; Caller states she just missed a call from the office.

## 2015-12-09 NOTE — Telephone Encounter (Signed)
Called pt and lvm advising her that her follow up appt is this Thurs at 4:15 pm.

## 2015-12-11 ENCOUNTER — Encounter: Payer: Self-pay | Admitting: Internal Medicine

## 2015-12-11 ENCOUNTER — Ambulatory Visit (INDEPENDENT_AMBULATORY_CARE_PROVIDER_SITE_OTHER): Payer: 59 | Admitting: Internal Medicine

## 2015-12-11 VITALS — BP 110/68 | HR 78 | Temp 97.8°F | Resp 12 | Wt 204.0 lb

## 2015-12-11 DIAGNOSIS — E890 Postprocedural hypothyroidism: Secondary | ICD-10-CM | POA: Diagnosis not present

## 2015-12-11 DIAGNOSIS — C73 Malignant neoplasm of thyroid gland: Secondary | ICD-10-CM

## 2015-12-11 NOTE — Patient Instructions (Signed)
Please stop at the lab.  Please continue Levothyroxine 100 mcg daily.  Take the thyroid hormone every day, with water, at least 30 minutes before breakfast, separated by at least 4 hours from: - acid reflux medications - calcium - iron - multivitamins  Please come back for a follow-up appointment in 6 months. 

## 2015-12-11 NOTE — Progress Notes (Signed)
Patient ID: Melinda Vazquez, female   DOB: September 06, 1965, 50 y.o.   MRN: [id number]   HPI  Melinda Vazquez is a 50 y.o.-year-old femalemale, returning for management of follicular variant of papillary thyroid cancer and postsurgical hypothyroidism. Last visit 6 mo ago. Insurance: [employer].  Reviewed and addended hx: - Pt was seen by PCP in 06/2014 and c/o feeling a neck mass >> she was sent for a thyroid U/S. - Thyroid U/S ([facility], 06/26/2014): 3.7 x 4.1 x 1.9 cm, complex, vascular. - FNA (10/15/14):  Adequacy Reason Satisfactory For Evaluation. Diagnosis THYROID, FINE NEEDLE ASPIRATION, MIDLINE INFERIOR ISTHMUS (SPECIMEN 1 OF 1, COLLECTED ON 10/15/14): MALIGNANT (BETHESDA CATEGORY VI). FINDINGS CONSISTENT WITH PAPILLARY THYROID CARCINOMA. [name] MD Pathologist, Electronic Signature (Case signed 10/16/2014) Specimen Clinical Information 3.7 x 4.1 x 1.9 cm heterogeneous hypervascular nodule inferior thyroid isthmus Source Thyroid, Fine Needle Aspiration, Midline Inferior Isthmus - Thyroidectomy (11/29/2014) - Dr Harlow Asa:   1. Thyroid, thyroidectomy, total - FOLLICULAR VARIANT OF PAPILLARY THYROID CARCINOMA, 3.7 CM, LIMITED TO THYROIDAL PARENCHYMA. - NO EVIDENCE OF ANGIOLYMPHATIC INVASION IDENTIFIED - RESECTION MARGINS, NEGATIVE FOR ATYPIA OR MALIGNANCY. 2. Lymph nodes, regional resection, central compartment lymph node VI - SEVEN LYMPH NODES, NEGATIVE FOR METASTATIC CARCINOMA (0/7) Microscopic Comment 1. THYROID Specimen: Thyroid gland Procedure: Total thyroidectomy Specimen Integrity (intact/fragmented): Intact Tumor focality: Unifocal Maximum tumor size (cm): 3.7 cm, gross measurement Tumor laterality: Right lobe, mid to inferior Histologic type (including subtype and/or unique features as applicable): Follicular variant of papillary thyroid carcinoma Tumor capsule: Not present Extrathyroidal extension: No Margins: Negative Lymph - Vascular invasion: Not identified. Capsular invasion  with degree of invasion if present: N/A Second and additional tumors: N/A Lymph nodes: # examined 7; # positive; 0 TNM code: pT2 , pN0 Non-neoplastic thyroid: Unremarkable. - RAI Tx (03/17/2015): 69.2 mCi  (done with Thyrogen) - post RAI tx WBS (03/24/2015): negative, except remnant activity in thyroid bed - 06/08/2016: Tg <0.1, ATA <1  She was started on Synthroid 100 mcg daily. - in am - 1h before b'fast - with water - no calcium - no iron - + MVI inconsistently - we moved these >4h after LT4 at last visit >> not taking them lately - no PPI  Last TSH:  Lab Results  Component Value Date   TSH 0.40 07/14/2015   TSH 0.20* 02/06/2015   FREET4 0.78 07/14/2015   FREET4 0.94 02/06/2015  06/26/2014: TSH 0.45  Pt c/o: - + hot flushes - + weight gain - Only 2 pounds since last visit, but 10 pounds previously - no tremors - no palpitations - no anxiety/depression - no hyperdefecation/ constipation - no dry skin - no hair falling - no fatigue  Pt does have a FH of thyroid ds. in daughter - thyroid cancer (? Type). No h/o radiation tx to head or neck.  She stopped smoking 11/2014.   ROS: Constitutional: + weight gain, no fatigue, + subjective hyperthermia Eyes: no blurry vision, no xerophthalmia ENT: no sore throat, no nodules palpated in throat, no dysphagia/odynophagia, no hoarseness Cardiovascular: no CP/+ SOB/no palpitations/leg swelling Respiratory: no cough/+ SOB Gastrointestinal: no N/V/D/C Musculoskeletal: no muscle/joint aches (hip) Skin: no rashes Neurological: no tremors/numbness/tingling/dizziness  I reviewed pt's medications, allergies, PMH, social hx, family hx, and changes were documented in the history of present illness. Otherwise, unchanged from my initial visit note:  Past Medical History  Diagnosis Date  . Hyperlipidemia   . Vitamin D deficiency   . Degenerative disc disease, lumbar  L5-S1  . Osteoporosis   . Fatigue   . Acute back pain   .  Acute hip pain   . Colon polyp   . Thyroid mass   . Thyroid nodule   . Anemia     hx of  . Cancer ([place]) 10/2014    papillary thyroid carcinoma-radiation after surgery   Past Surgical History  Procedure Laterality Date  . Salpingoophorectomy      one side  . Endometrial ablation  2010  . Colonoscopy  01/24/14  . Biopsy thyroid Bilateral 10/2014  . Tubal ligation  2011  . Ectopic pregnancy surgery  1998  . Thyroidectomy N/A 11/29/2014    Procedure: TOTAL THYROIDECTOMY LIMITED LYMPH NODE DISSECTION;  Surgeon: Armandina Gemma, MD;  Location: [facility];  Service: General;  Laterality: N/A;  . Lymph node dissection N/A 11/29/2014    Procedure: LIMITED LYMPH NODE DISSECTION;  Surgeon: Armandina Gemma, MD;  Location: [facility];  Service: General;  Laterality: N/A;   History   Social History  . Marital Status: Single    Spouse Name: N/A  . Number of Children: 2   Occupational History  . Small business customer svs rep   Social History Main Topics  . Smoking status: Current Some Day Smoker  . Smokeless tobacco: Not on file  . Alcohol Use: Occasional, red wine  . Drug Use: No  . Sexual Activity: Yes    Birth Control/ Protection: Surgical   Social History Narrative   Bone Density Study: 09/06/13   Pap Smear: 12/2012   Mammogram: 09/2012      Current Outpatient Prescriptions on File Prior to Visit  Medication Sig Dispense Refill  . acetaminophen (TYLENOL) 500 MG tablet Take 1,000 mg by mouth every 6 (six) hours as needed.    . Coenzyme Q10-Red Yeast Rice 60-600 MG CAPS Take 1 tablet by mouth 2 (two) times daily.    . cyclobenzaprine (FLEXERIL) 10 MG tablet Take 10 mg by mouth.     Marland Kitchen ibuprofen (ADVIL,MOTRIN) 200 MG tablet Take 200-400 mg by mouth every 6 (six) hours as needed for headache or moderate pain.    . Multiple Vitamin (MULTIVITAMIN WITH MINERALS) TABS tablet Take 1 tablet by mouth every morning.    . naproxen (NAPROSYN) 500 MG tablet Take 500 mg by mouth as needed.     Marland Kitchen OVER THE COUNTER  MEDICATION Take 10 mLs by mouth every morning. Braggs Vinegar    . OVER THE COUNTER MEDICATION Take 1 tablet by mouth daily. Bone strength vitamin    . OVER THE COUNTER MEDICATION Take 1 tablet by mouth 2 (two) times daily. PM Phytogen Complex    . SYNTHROID 100 MCG tablet TAKE ONE TABLET BY MOUTH ONCE DAILY 90 tablet 0  . Tetrahydrozoline HCl (VISINE OP) Apply 1-2 drops to eye daily as needed (red dry eyes.).    Marland Kitchen traMADol (ULTRAM) 50 MG tablet Take 50 mg by mouth as needed.      No current facility-administered medications on file prior to visit.   No Known Allergies Family History  Problem Relation Age of Onset  . Diabetes Mother     DM Type II   . Hyperlipidemia Mother   . Hypertension Mother   . Diabetes Father     DM Type II   . Hyperlipidemia Father   . Heart disease Father   . Hypertension Father    PE: BP 110/68 mmHg  Pulse 78  Temp(Src) 97.8 F (36.6 C) (Oral)  Resp  12  Wt 204 lb (92.534 kg)  SpO2 99% Body mass index is 36.15 kg/(m^2). Wt Readings from Last 3 Encounters:  12/11/15 204 lb (92.534 kg)  06/09/15 203 lb (92.08 kg)  02/06/15 193 lb 9.6 oz (87.816 kg)   Constitutional: overweight, in NAD Eyes: PERRLA, EOMI, no exophthalmos ENT: moist mucous membranes, thyroid scar healing - no keloid, no pain at the surgical site, no cervical lymphadenopathy Cardiovascular: RRR, No MRG Respiratory: CTA B Gastrointestinal: abdomen soft, NT, ND, BS+ Musculoskeletal: no deformities, strength intact in all 4;  Skin: moist, warm, no rashes Neurological: no tremor with outstretched hands, DTR normal in all 4  ASSESSMENT: 1. Follicular variant of Papillary thyroid cancer  2. Postsurgical hypothyroidism   PLAN: 1. FV of PTC - no swelling, pain or erythema at surgical site. No masses palpated in neck - She is at low risk since the PTC was:  follicular variant  unifocal  did not spread to the lymph vessels (0/7 LN analyzed)  She had RAI treatment in  03/2015  Reviewed post Tx WBS with her >> no mets - check TFTs today, along with Tg + ATA. Previous thyroglobulin was undetectable. - We discussed about repeating a thyroid ultrasound a year after her surgery, but I do not feel she needs a whole body scan unless the thyroglobulin trends up.  2. Postsurgical hypothyroidism - Patient is on Synthroid 100 g daily  - She is taking the medication correctly now: every day, with water, >30 minutes before breakfast, separated by >4 hours from acid reflux medications, calcium, iron, multivitamins. - Will check her thyroid tests now and ask her to return in 6 months  Office Visit on 12/11/2015  Component Date Value Ref Range Status  . TSH 12/11/2015 1.44  0.35 - 4.50 uIU/mL Final  . Free T4 12/11/2015 0.88  0.60 - 1.60 ng/dL Final  . Thyroglobulin 12/11/2015 0.1* 2.8 - 40.9 ng/mL Final   Comment: Thyroglobulin antibodies (TGAb) interfere with Thyroglobulin (TG) assays; therefore, Thyroglobulin antibody (TGAb) assay should always be performed in conjunction with a Thyroglobulin (TG) assay.   This test was performed using the Beckman Coulter chemiluminescent method.  Values obtained from different assay methods cannot be used interchangeably.  Thyroglobulin levels, regardless of value, should not be interpreted as absolute evidence of the presence or absence of disease.   . Thyroglobulin Ab 12/11/2015 <1  <2 IU/mL Final   TFTs normal and Tg very low, which is great!

## 2015-12-12 LAB — TSH: TSH: 1.44 u[IU]/mL (ref 0.35–4.50)

## 2015-12-12 LAB — T4, FREE: Free T4: 0.88 ng/dL (ref 0.60–1.60)

## 2015-12-13 LAB — THYROGLOBULIN LEVEL: Thyroglobulin: 0.1 ng/mL — ABNORMAL LOW (ref 2.8–40.9)

## 2015-12-13 LAB — THYROGLOBULIN ANTIBODY: Thyroglobulin Ab: <1 IU/mL (ref <2)

## 2015-12-15 ENCOUNTER — Telehealth: Payer: Self-pay | Admitting: Internal Medicine

## 2015-12-15 NOTE — Telephone Encounter (Signed)
PT requested lab work results, she said if she does not answer to please leave a message.

## 2015-12-16 NOTE — Telephone Encounter (Signed)
Returned pt's call. Advised pt of her lab results. Pt voiced understanding.

## 2015-12-19 ENCOUNTER — Other Ambulatory Visit: Payer: 59

## 2015-12-22 ENCOUNTER — Ambulatory Visit
Admission: RE | Admit: 2015-12-22 | Discharge: 2015-12-22 | Disposition: A | Discharge status: Home / Self Care | Payer: 59 | Source: Ambulatory Visit | Attending: Internal Medicine | Admitting: Internal Medicine

## 2015-12-23 ENCOUNTER — Telehealth: Payer: Self-pay | Admitting: Internal Medicine

## 2015-12-23 NOTE — Telephone Encounter (Signed)
Pt returning your call

## 2016-01-08 ENCOUNTER — Telehealth: Payer: Self-pay | Admitting: Internal Medicine

## 2016-01-08 NOTE — Telephone Encounter (Signed)
No, I do not remember seeing a form like this.

## 2016-01-08 NOTE — Telephone Encounter (Signed)
Patient stated that a form for her recertification for intermittent time for her insurance,was sent twice to approve her time, wasn't filled out in time, therefore it was denied.  So just want to know if you received the paper work or not, please advise Per stephanie

## 2016-01-12 NOTE — Telephone Encounter (Signed)
PT called back to find out if we received the forms for her job that she called about last week, she said that they have been faxed over multiple times.

## 2016-01-13 NOTE — Telephone Encounter (Signed)
I contacted the pt and left a vm advising we had not received the fax. Pt was advised of our fax number and to please have the forms faxed again. Pt was also advised Dr. Cruzita Lederer is out of the office and will return on 01/19/2016. Once Dr. Cruzita Lederer returns forms will be reviewed.

## 2016-01-20 DIAGNOSIS — Z7689 Persons encountering health services in other specified circumstances: Secondary | ICD-10-CM

## 2016-01-21 ENCOUNTER — Telehealth: Payer: Self-pay

## 2016-01-21 NOTE — Telephone Encounter (Signed)
Called patient. Left patient a message that her FMLA papers were ready for her to come pick up at anytime. They would be placed up front.

## 2016-04-20 ENCOUNTER — Other Ambulatory Visit: Payer: Self-pay | Admitting: Internal Medicine

## 2016-04-20 NOTE — Telephone Encounter (Signed)
Pt needs refill of synthyroid asap she is completely out

## 2016-06-11 ENCOUNTER — Encounter: Payer: Self-pay | Admitting: Internal Medicine

## 2016-06-11 ENCOUNTER — Ambulatory Visit (INDEPENDENT_AMBULATORY_CARE_PROVIDER_SITE_OTHER): Payer: 59 | Admitting: Internal Medicine

## 2016-06-11 VITALS — BP 140/80 | HR 69 | Ht 63.0 in | Wt 203.4 lb

## 2016-06-11 DIAGNOSIS — E890 Postprocedural hypothyroidism: Secondary | ICD-10-CM

## 2016-06-11 DIAGNOSIS — C73 Malignant neoplasm of thyroid gland: Secondary | ICD-10-CM

## 2016-06-11 NOTE — Patient Instructions (Addendum)
Please stop at the lab.  Please continue Synthroid 100 mcg daily.  Take the thyroid hormone every day, with water, at least 30 minutes before breakfast, separated by at least 4 hours from: - acid reflux medications - calcium - iron - multivitamins  Please return in 1 year. 

## 2016-06-11 NOTE — Progress Notes (Addendum)
Patient ID: Melinda Vazquez, female   DOB: 1965/10/10, 50 y.o.   MRN: [id number]   HPI  Melinda Vazquez is a 50 y.o.-year-old female, returning for management of follicular variant of papillary thyroid cancer and postsurgical hypothyroidism. Last visit 6 mo ago. Insurance: [employer].  Reviewed and addended hx: - Pt was seen by PCP in 06/2014 and c/o feeling a neck mass >> she was sent for a thyroid U/S. - Thyroid U/S ([facility], 06/26/2014): 3.7 x 4.1 x 1.9 cm, complex, vascular. - FNA (10/15/14):  Adequacy Reason Satisfactory For Evaluation. Diagnosis THYROID, FINE NEEDLE ASPIRATION, MIDLINE INFERIOR ISTHMUS (SPECIMEN 1 OF 1, COLLECTED ON 10/15/14): MALIGNANT (BETHESDA CATEGORY VI). FINDINGS CONSISTENT WITH PAPILLARY THYROID CARCINOMA. [name] MD Pathologist, Electronic Signature (Case signed 10/16/2014) Specimen Clinical Information 3.7 x 4.1 x 1.9 cm heterogeneous hypervascular nodule inferior thyroid isthmus Source Thyroid, Fine Needle Aspiration, Midline Inferior Isthmus - Thyroidectomy (11/29/2014) - Dr Harlow Asa:   1. Thyroid, thyroidectomy, total - FOLLICULAR VARIANT OF PAPILLARY THYROID CARCINOMA, 3.7 CM, LIMITED TO THYROIDAL PARENCHYMA. - NO EVIDENCE OF ANGIOLYMPHATIC INVASION IDENTIFIED - RESECTION MARGINS, NEGATIVE FOR ATYPIA OR MALIGNANCY. 2. Lymph nodes, regional resection, central compartment lymph node VI - SEVEN LYMPH NODES, NEGATIVE FOR METASTATIC CARCINOMA (0/7) Microscopic Comment 1. THYROID Specimen: Thyroid gland Procedure: Total thyroidectomy Specimen Integrity (intact/fragmented): Intact Tumor focality: Unifocal Maximum tumor size (cm): 3.7 cm, gross measurement Tumor laterality: Right lobe, mid to inferior Histologic type (including subtype and/or unique features as applicable): Follicular variant of papillary thyroid carcinoma Tumor capsule: Not present Extrathyroidal extension: No Margins: Negative Lymph - Vascular invasion: Not identified. Capsular invasion  with degree of invasion if present: N/A Second and additional tumors: N/A Lymph nodes: # examined 7; # positive; 0 TNM code: pT2 , pN0 Non-neoplastic thyroid: Unremarkable. - RAI Tx (03/17/2015): 69.2 mCi  (done with Thyrogen) - post RAI tx WBS (03/24/2015): negative, except remnant activity in thyroid bed - 06/09/2015: Tg <0.1, ATA <1 - 12/11/2015: Tg 0.1, ATA <1  She is on Synthroid 100 mcg daily: - in am - 1-2h before b'fast - with water - no calcium - no iron - + MVI at noon - no PPI  Last TSH:  Lab Results  Component Value Date   TSH 1.44 12/11/2015   TSH 0.40 07/14/2015   TSH 0.20 (L) 02/06/2015   FREET4 0.88 12/11/2015   FREET4 0.78 07/14/2015   FREET4 0.94 02/06/2015  06/26/2014: TSH 0.45  Pt c/o: - + hot flushes - no weight gain  - no tremors - no palpitations - no anxiety/depression - no hyperdefecation/ constipation - no dry skin - no hair falling - no fatigue  Pt does have a FH of thyroid ds. in daughter - thyroid cancer (? Type). No h/o radiation tx to head or neck.   ROS: Constitutional: no weight gain, no fatigue, + hot flushes Eyes: no blurry vision, no xerophthalmia ENT: no sore throat, no nodules palpated in throat, no dysphagia/odynophagia, no hoarseness Cardiovascular: no CP/SOB/no palpitations/leg swelling Respiratory: no cough/SOB Gastrointestinal: no N/V/D/C Musculoskeletal: no muscle/joint aches (hip) Skin: no rashes Neurological: no tremors/numbness/tingling/dizziness  I reviewed pt's medications, allergies, PMH, social hx, family hx, and changes were documented in the history of present illness. Otherwise, unchanged from my initial visit note:  Past Medical History:  Diagnosis Date  . Acute back pain   . Acute hip pain   . Anemia    hx of  . Cancer ([place]) 10/2014   papillary thyroid carcinoma-radiation after surgery  .  Colon polyp   . Degenerative disc disease, lumbar    L5-S1  . Fatigue   . Hyperlipidemia   . Osteoporosis    . Thyroid mass   . Thyroid nodule   . Vitamin D deficiency    Past Surgical History:  Procedure Laterality Date  . BIOPSY THYROID Bilateral 10/2014  . COLONOSCOPY  01/24/14  . ECTOPIC PREGNANCY SURGERY  1998  . ENDOMETRIAL ABLATION  2010  . LYMPH NODE DISSECTION N/A 11/29/2014   Procedure: LIMITED LYMPH NODE DISSECTION;  Surgeon: Armandina Gemma, MD;  Location: [facility];  Service: General;  Laterality: N/A;  . SALPINGOOPHORECTOMY     one side  . THYROIDECTOMY N/A 11/29/2014   Procedure: TOTAL THYROIDECTOMY LIMITED LYMPH NODE DISSECTION;  Surgeon: Armandina Gemma, MD;  Location: [facility];  Service: General;  Laterality: N/A;  . TUBAL LIGATION  2011   History   Social History  . Marital Status: Single    Spouse Name: N/A  . Number of Children: 2   Occupational History  . Small business customer svs rep   Social History Main Topics  . Smoking status: Current Some Day Smoker  . Smokeless tobacco: Not on file  . Alcohol Use: Occasional, red wine  . Drug Use: No  . Sexual Activity: Yes    Birth Control/ Protection: Surgical   Social History Narrative   Bone Density Study: 09/06/13   Pap Smear: 12/2012   Mammogram: 09/2012      Current Outpatient Prescriptions on File Prior to Visit  Medication Sig Dispense Refill  . acetaminophen (TYLENOL) 500 MG tablet Take 1,000 mg by mouth every 6 (six) hours as needed.    . Coenzyme Q10-Red Yeast Rice 60-600 MG CAPS Take 1 tablet by mouth 2 (two) times daily.    . cyclobenzaprine (FLEXERIL) 10 MG tablet Take 10 mg by mouth.     Marland Kitchen ibuprofen (ADVIL,MOTRIN) 200 MG tablet Take 200-400 mg by mouth every 6 (six) hours as needed for headache or moderate pain.    . methocarbamol (ROBAXIN) 750 MG tablet Take 750 mg by mouth as needed.     . Multiple Vitamin (MULTIVITAMIN WITH MINERALS) TABS tablet Take 1 tablet by mouth every morning.    . naproxen (NAPROSYN) 500 MG tablet Take 500 mg by mouth as needed.     Marland Kitchen OVER THE COUNTER MEDICATION Take 10 mLs by mouth  every morning. Braggs Vinegar    . OVER THE COUNTER MEDICATION Take 1 tablet by mouth daily. Bone strength vitamin    . OVER THE COUNTER MEDICATION Take 1 tablet by mouth 2 (two) times daily. PM Phytogen Complex    . SYNTHROID 100 MCG tablet TAKE ONE TABLET BY MOUTH ONCE DAILY 90 tablet 0  . SYNTHROID 100 MCG tablet TAKE ONE TABLET BY MOUTH ONCE DAILY 30 tablet 5  . Tetrahydrozoline HCl (VISINE OP) Apply 1-2 drops to eye daily as needed (red dry eyes.).    Marland Kitchen traMADol (ULTRAM) 50 MG tablet Take 50 mg by mouth as needed.      No current facility-administered medications on file prior to visit.    No Known Allergies Family History  Problem Relation Age of Onset  . Diabetes Mother     DM Type II   . Hyperlipidemia Mother   . Hypertension Mother   . Diabetes Father     DM Type II   . Hyperlipidemia Father   . Heart disease Father   . Hypertension Father    PE: BP  140/80 (BP Location: Left Arm, Patient Position: Sitting, Cuff Size: Normal)   Pulse 69   Ht '5\' 3"'  (1.6 m)   Wt 203 lb 6.4 oz (92.3 kg)   SpO2 99%   BMI 36.03 kg/m  Body mass index is 36.03 kg/m. Wt Readings from Last 3 Encounters:  06/11/16 203 lb 6.4 oz (92.3 kg)  12/11/15 204 lb (92.5 kg)  06/09/15 203 lb (92.1 kg)   Constitutional: overweight, in NAD Eyes: PERRLA, EOMI, no exophthalmos ENT: moist mucous membranes, thyroid scar completely healed, no cervical lymphadenopathy Cardiovascular: RRR, No MRG Respiratory: CTA B Gastrointestinal: abdomen soft, NT, ND, BS+ Musculoskeletal: no deformities, strength intact in all 4;  Skin: moist, warm, no rashes Neurological: no tremor with outstretched hands, DTR normal in all 4  ASSESSMENT: 1. Follicular variant of Papillary thyroid cancer  2. Postsurgical hypothyroidism   PLAN: 1. FV of PTC - She is at low risk since the PTC was:  follicular variant  unifocal  did not spread to the lymph vessels (0/7 LN analyzed)  She had RAI treatment in 03/2015  post Tx  WBS showed no mets - check TFTs today, along with Tg + ATA. Previous thyroglobulin was very low at last visit, previously undetectable - We discussed about repeating a thyroid ultrasound now, but I do not feel she needs a whole body scan unless the thyroglobulin trends up.  2. Postsurgical hypothyroidism - Patient is on Synthroid 100 g daily  - She is taking the medication correctly now: every day, with water, >30 minutes before breakfast, separated by >4 hours from acid reflux medications, calcium, iron, multivitamins. - Will check her thyroid tests now and ask her to return in 1 year  Component     Latest Ref Rng & Units 06/11/2016  TSH     mIU/L 1.45  T4,Free(Direct)     0.8 - 1.8 ng/dL 1.1  Thyroglobulin     ng/mL 0.1 (L)  Thyroglobulin Ab     <2 IU/mL <1   Thyroid ultrasound: US SOFT TISSUE HEAD AND NECK  Order: [id number]  Status:  Final result Visible to patient:  No (Not Released) Dx:  Papillary thyroid carcinoma [place])  Details   Reading Physician Reading Date Result Priority  Sandi Mariscal, MD 07/31/2016   Narrative    CLINICAL DATA: Other. History of papillary cancer, post total thyroidectomy and radioactive iodine ablation.  EXAM: THYROID ULTRASOUND  TECHNIQUE: Ultrasound examination of the thyroid gland and adjacent soft tissues was performed.  COMPARISON: 12/22/2015  FINDINGS: Isthmus: Surgically absent.  There is no residual nodular soft tissue within isthmic resection bed.  _________________________________________________________  Right lobe: Surgically absent.  There is no residual nodular soft tissue within the right lobectomy resection bed.  _________________________________________________________  Left lobe: Surgically absent.  There is no residual soft tissue within the left lobectomy resection bed.  IMPRESSION: Post total thyroidectomy without evidence of nodular tissue within the resection bed to suggest locally recurrent or  metastatic disease.  The above is in keeping with the ACR TI-RADS recommendations - J Am Coll Radiol 2017;14:587-595.   Electronically Signed By: Sandi Mariscal M.D. On: 07/31/2016 08:41        Philemon Kingdom, MD PhD [facility] Endocrinology

## 2016-06-12 LAB — THYROGLOBULIN LEVEL: Thyroglobulin: 0.1 ng/mL — ABNORMAL LOW

## 2016-06-12 LAB — THYROGLOBULIN ANTIBODY: Thyroglobulin Ab: <1 IU/mL (ref <2)

## 2016-06-12 LAB — T4, FREE: FREE T4: 1.1 ng/dL (ref 0.8–1.8)

## 2016-06-12 LAB — TSH: TSH: 1.45 m[IU]/L

## 2016-06-15 ENCOUNTER — Telehealth: Payer: Self-pay | Admitting: Internal Medicine

## 2016-06-15 NOTE — Telephone Encounter (Signed)
Patient is returning your call.  

## 2016-06-15 NOTE — Telephone Encounter (Signed)
Reference # for the paperwork is [id number]  Please send the paperwork to [facility] certification once done to F# [phone] she thinks

## 2016-06-15 NOTE — Telephone Encounter (Signed)
lmtrc

## 2016-06-15 NOTE — Telephone Encounter (Signed)
Pt is asking if we can let her know that we have received the FMLA forms from her insurance company. If we have received it can we put the reference number on it, she is going to call us back with the number

## 2016-06-17 IMAGING — NM NM [ID] THYROID CANCER METS SP CA TX
6 series · 6 of 6 positions shown · non-contrast
Comparison: None.

CLINICAL DATA: Ten days post I 131 thyroid therapy. Follicular
variant papillary thyroid carcinoma. T2 N0 disease

EXAM:
NUCLEAR MEDICINE U-202 POST THERAPY WHOLE BODY SCAN
TECHNIQUE: The patient received 69.2 mCi U-202 sodium iodide for the treatment
of thyroid cancer within the past 10 days. The patient returns
today, and whole body scanning was performed in the anterior and
posterior projections.

[Series 1: marker · 4.14mm/px · 1 of 1 slices shown (1 of 2)]
[im 1/1]
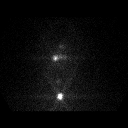

[Series 1: marker · 4.14mm/px · 1 of 1 slices shown (2 of 2)]
[im 1/1  full-range]
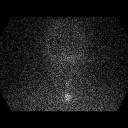

[Series 2: static thyroid no marker · 4.14mm/px · 1 of 1 slices shown (1 of 2)]
[im 1/1]
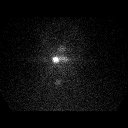

[Series 2: static thyroid no marker · 4.14mm/px · 1 of 1 slices shown (2 of 2)]
[im 1/1  full-range]
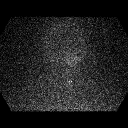

[Series 3: i131 whole body · 2.66mm/px · 1 of 1 slices shown (1 of 2)]
[im 1/1  full-range]
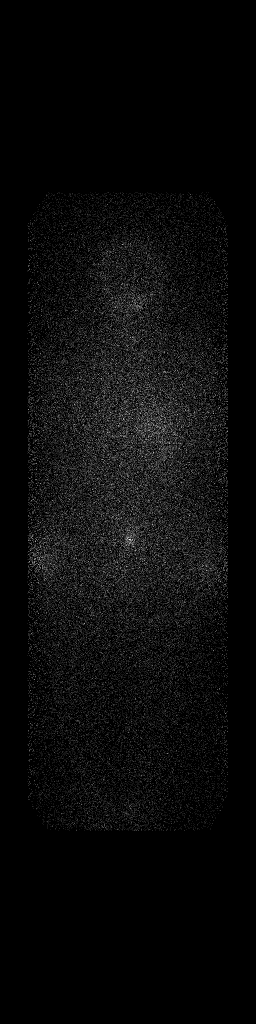

[Series 3: i131 whole body · 2.66mm/px · 1 of 1 slices shown (2 of 2)]
[im 1/1  full-range]
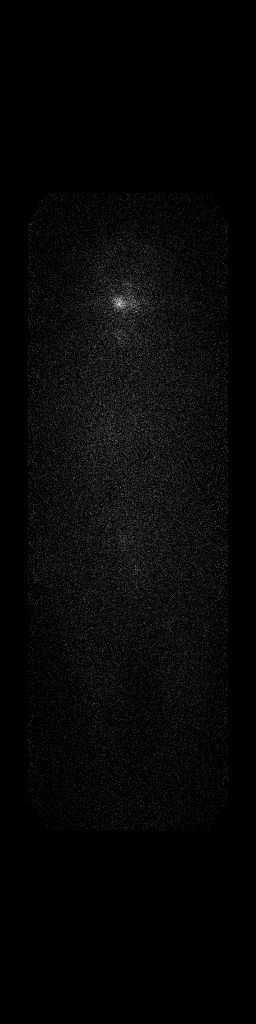

[6 of 6 positions shown; findings below may reference images not displayed]

FINDINGS: Small amount of activity in the thyroid bed. No abnormal uptake on
the whole-body scan.
IMPRESSION: 1. Remnant activity in thyroid bed.
2. No evidence of distant metastasis

## 2016-06-28 NOTE — Telephone Encounter (Signed)
Please advise, thanks.

## 2016-06-28 NOTE — Telephone Encounter (Signed)
Can you please talk to them to see exactly what they need and have them send the form again. We filled this out and it went back and forth few times before.Melinda KitchenMarland Vazquez

## 2016-06-28 NOTE — Telephone Encounter (Signed)
Pt is asking for the FMLA paperwork to be done she is asking what the status is.   She keeps getting incidents and its because the paperwork is being denied

## 2016-06-29 NOTE — Telephone Encounter (Signed)
Left detailed message.  Pt to call back with number to request FMLA paperwork and info on what they're needing.

## 2016-06-29 NOTE — Telephone Encounter (Signed)
Pt is asking for you to call her when this is taken care of and completed

## 2016-07-02 NOTE — Telephone Encounter (Signed)
Patient is returning your call.  

## 2016-07-29 ENCOUNTER — Telehealth: Payer: Self-pay | Admitting: Internal Medicine

## 2016-07-29 NOTE — Telephone Encounter (Signed)
Patient stated she didn't get a call to schedule a her ultrasound.. She ask if you can give her the number today so she call to schedule it her self.

## 2016-07-29 NOTE — Telephone Encounter (Signed)
I contacted the patient and left a voicemail advising of information stated below.  [facility]  [phone]

## 2016-07-30 ENCOUNTER — Ambulatory Visit
Admission: RE | Admit: 2016-07-30 | Discharge: 2016-07-30 | Disposition: A | Discharge status: Home / Self Care | Payer: 59 | Source: Ambulatory Visit | Attending: Internal Medicine | Admitting: Internal Medicine

## 2016-07-30 DIAGNOSIS — C73 Malignant neoplasm of thyroid gland: Secondary | ICD-10-CM

## 2016-08-03 ENCOUNTER — Telehealth: Payer: Self-pay

## 2016-08-03 NOTE — Telephone Encounter (Signed)
Called patient to notify of [place] results. No answer,, left message for patient to call back and gave call back number.

## 2016-08-03 NOTE — Telephone Encounter (Signed)
Patient returned phone call, I gave results from [place]. Patient had no questions at this time.

## 2016-11-10 ENCOUNTER — Other Ambulatory Visit: Payer: Self-pay | Admitting: Internal Medicine

## 2017-06-02 ENCOUNTER — Other Ambulatory Visit: Payer: Self-pay | Admitting: Internal Medicine

## 2017-06-10 ENCOUNTER — Encounter: Payer: Self-pay | Admitting: Internal Medicine

## 2017-06-10 ENCOUNTER — Ambulatory Visit (INDEPENDENT_AMBULATORY_CARE_PROVIDER_SITE_OTHER): Payer: 59 | Admitting: Internal Medicine

## 2017-06-10 ENCOUNTER — Ambulatory Visit: Payer: 59 | Admitting: Internal Medicine

## 2017-06-10 VITALS — BP 136/88 | HR 66 | Ht 63.0 in | Wt 204.0 lb

## 2017-06-10 DIAGNOSIS — C73 Malignant neoplasm of thyroid gland: Secondary | ICD-10-CM

## 2017-06-10 DIAGNOSIS — E890 Postprocedural hypothyroidism: Secondary | ICD-10-CM | POA: Diagnosis not present

## 2017-06-10 NOTE — Progress Notes (Signed)
Patient ID: Melinda Vazquez, female   DOB: July 28, 1966, 51 y.o.   MRN: [id number]   HPI  Melinda Vazquez is a 51 y.o.-year-old female, returning for management of follicular variant of papillary thyroid cancer and postsurgical hypothyroidism. Last visit 6 mo ago. Insurance: [employer].  Reviewed and addended hx: - Pt was seen by PCP in 06/2014 and c/o feeling a neck mass >> she was sent for a thyroid U/S. - Thyroid U/S ([facility], 06/26/2014): 3.7 x 4.1 x 1.9 cm, complex, vascular. - FNA (10/15/14):  Adequacy Reason Satisfactory For Evaluation. Diagnosis THYROID, FINE NEEDLE ASPIRATION, MIDLINE INFERIOR ISTHMUS (SPECIMEN 1 OF 1, COLLECTED ON 10/15/14): MALIGNANT (BETHESDA CATEGORY VI). FINDINGS CONSISTENT WITH PAPILLARY THYROID CARCINOMA. Melinda Laws MD Pathologist, Electronic Signature (Case signed 10/16/2014) Specimen Clinical Information 3.7 x 4.1 x 1.9 cm heterogeneous hypervascular nodule inferior thyroid isthmus Source Thyroid, Fine Needle Aspiration, Midline Inferior Isthmus - Thyroidectomy (11/29/2014) - Dr Melinda Vazquez:   1. Thyroid, thyroidectomy, total - FOLLICULAR VARIANT OF PAPILLARY THYROID CARCINOMA, 3.7 CM, LIMITED TO THYROIDAL PARENCHYMA. - NO EVIDENCE OF ANGIOLYMPHATIC INVASION IDENTIFIED - RESECTION MARGINS, NEGATIVE FOR ATYPIA OR MALIGNANCY. 2. Lymph nodes, regional resection, central compartment lymph node VI - SEVEN LYMPH NODES, NEGATIVE FOR METASTATIC CARCINOMA (0/7) Microscopic Comment 1. THYROID Specimen: Thyroid gland Procedure: Total thyroidectomy Specimen Integrity (intact/fragmented): Intact Tumor focality: Unifocal Maximum tumor size (cm): 3.7 cm, gross measurement Tumor laterality: Right lobe, mid to inferior Histologic type (including subtype and/or unique features as applicable): Follicular variant of papillary thyroid carcinoma Tumor capsule: Not present Extrathyroidal extension: No Margins: Negative Lymph - Vascular invasion: Not identified. Capsular invasion  with degree of invasion if present: N/A Second and additional tumors: N/A Lymph nodes: # examined 7; # positive; 0 TNM code: pT2 , pN0 Non-neoplastic thyroid: Unremarkable. - RAI Tx (03/17/2015): 69.2 mCi  (done with Thyrogen) - post RAI tx WBS (03/24/2015): negative, except remnant activity in thyroid bed - 06/09/2015: Tg <0.1, ATA <1 - 12/11/2015: Tg 0.1, ATA <1 -06/11/2016: Tg 0.1, ATA <1 - 07/31/2016: Neck ultrasound: Post total thyroidectomy without evidence of nodular tissue within the resection bed to suggest locally recurrent or metastatic disease.  She is on Synthroid 100 mcg daily - in am - fasting - 1-2h before b'fast  - with water - no Ca, Fe, PPIs - + MVI at noon - just started Biotin >> took ~ 1700 mcg today!  Last TSH:  Lab Results  Component Value Date   TSH 1.45 06/11/2016   TSH 1.44 12/11/2015   TSH 0.40 07/14/2015   TSH 0.20 (L) 02/06/2015   FREET4 1.1 06/11/2016   FREET4 0.88 12/11/2015   FREET4 0.78 07/14/2015   FREET4 0.94 02/06/2015  06/26/2014: TSH 0.45  Pt denies: - feeling nodules in neck - hoarseness - dysphagia - choking - SOB with lying down  She has GERD >> saw ENT >> throat irritation.  Pt does have a FH of thyroid ds. in daughter - thyroid cancer (? Type). No h/o radiation tx to head or neck.   ROS: Constitutional: + weight gain/no weight loss, no fatigue, + hot flushes, no subjective hypothermia Eyes: no blurry vision, no xerophthalmia ENT: no sore throat, + see HPI Cardiovascular: no CP/no SOB/no palpitations/no leg swelling Respiratory: no cough/no SOB/no wheezing Gastrointestinal: no N/no V/no D/no C/no acid reflux Musculoskeletal: no muscle aches/no joint aches Skin: no rashes, no hair loss Neurological: no tremors/no numbness/no tingling/no dizziness  I reviewed pt's medications, allergies, PMH, social hx, family hx, and changes were  documented in the history of present illness. Otherwise, unchanged from my initial visit  note.  Past Medical History:  Diagnosis Date  . Acute back pain   . Acute hip pain   . Anemia    hx of  . Cancer ([place]) 10/2014   papillary thyroid carcinoma-radiation after surgery  . Colon polyp   . Degenerative disc disease, lumbar    L5-S1  . Fatigue   . Hyperlipidemia   . Osteoporosis   . Thyroid mass   . Thyroid nodule   . Vitamin D deficiency    Past Surgical History:  Procedure Laterality Date  . BIOPSY THYROID Bilateral 10/2014  . COLONOSCOPY  01/24/14  . ECTOPIC PREGNANCY SURGERY  1998  . ENDOMETRIAL ABLATION  2010  . SALPINGOOPHORECTOMY     one side  . TUBAL LIGATION  2011   History   Social History  . Marital Status: Single    Spouse Name: N/A  . Number of Children: 2   Occupational History  . Small business customer svs rep   Social History Main Topics  . Smoking status: Current Some Day Smoker  . Smokeless tobacco: Not on file  . Alcohol Use: Occasional, red wine  . Drug Use: No  . Sexual Activity: Yes    Birth Control/ Protection: Surgical   Social History Narrative   Bone Density Study: 09/06/13   Pap Smear: 12/2012   Mammogram: 09/2012      Current Outpatient Medications on File Prior to Visit  Medication Sig Dispense Refill  . acetaminophen (TYLENOL) 500 MG tablet Take 1,000 mg by mouth every 6 (six) hours as needed.    . Coenzyme Q10-Red Yeast Rice 60-600 MG CAPS Take 1 tablet by mouth 2 (two) times daily.    . cyclobenzaprine (FLEXERIL) 10 MG tablet Take 10 mg by mouth.     Marland Kitchen ibuprofen (ADVIL,MOTRIN) 200 MG tablet Take 200-400 mg by mouth every 6 (six) hours as needed for headache or moderate pain.    . methocarbamol (ROBAXIN) 750 MG tablet Take 750 mg by mouth as needed.     . Multiple Vitamin (MULTIVITAMIN WITH MINERALS) TABS tablet Take 1 tablet by mouth every morning.    . naproxen (NAPROSYN) 500 MG tablet Take 500 mg by mouth as needed.     Marland Kitchen OVER THE COUNTER MEDICATION Take 10 mLs by mouth every morning. Braggs Vinegar    . OVER THE  COUNTER MEDICATION Take 1 tablet by mouth daily. Bone strength vitamin    . OVER THE COUNTER MEDICATION Take 1 tablet by mouth 2 (two) times daily. PM Phytogen Complex    . SYNTHROID 100 MCG tablet TAKE 1 TABLET BY MOUTH ONCE DAILY 30 tablet 0  . Tetrahydrozoline HCl (VISINE OP) Apply 1-2 drops to eye daily as needed (red dry eyes.).    Marland Kitchen traMADol (ULTRAM) 50 MG tablet Take 50 mg by mouth as needed.      No current facility-administered medications on file prior to visit.    No Known Allergies Family History  Problem Relation Age of Onset  . Diabetes Mother        DM Type II   . Hyperlipidemia Mother   . Hypertension Mother   . Diabetes Father        DM Type II   . Hyperlipidemia Father   . Heart disease Father   . Hypertension Father    PE: BP 136/88   Pulse 66   Ht '5\' 3"'  (1.6 m)  Wt 204 lb (92.5 kg)   BMI 36.14 kg/m  Body mass index is 36.14 kg/m. Wt Readings from Last 3 Encounters:  06/10/17 204 lb (92.5 kg)  06/11/16 203 lb 6.4 oz (92.3 kg)  12/11/15 204 lb (92.5 kg)   Constitutional: overweight, in NAD Eyes: PERRLA, EOMI, no exophthalmos ENT: moist mucous membranes, no masses felt in neck, cervical scar healed, no cervical lymphadenopathy Cardiovascular: RRR, No MRG Respiratory: CTA B Gastrointestinal: abdomen soft, NT, ND, BS+ Musculoskeletal: no deformities, strength intact in all 4 Skin: moist, warm, no rashes Neurological: no tremor with outstretched hands, DTR normal in all 4  ASSESSMENT: 1. Follicular variant of Papillary thyroid cancer  2. Postsurgical hypothyroidism   PLAN: 1. FV of PTC - Patient had a low risk papillary thyroid cancer, the follicular variant,  Which was also:  unifocal  did not spread to the lymph vessels (0/7 LN analyzed)  She had RAI treatment in 03/2015  post Tx WBS showed no mets - At last visit, we checked a neck ultrasound that did not show any recurrences or metastases in the neck - She tells me that she does have  irritation in her neck as diagnosed by ENT and was given acid reflux suppresses.  She tried a PPI but did not fill that this helped.  She actually had more acid reflux after she stopped the medication.  She is planning to see GI for this problem - We will repeat TFTs along with Tg + ATA - but we cannot check this today since she just to biotin.  She will come back in several days for these labs.   Of note, previous thyroglobulin levels have been undetectable. - Her thyroidectomy scar is completely healed and inconspicuous  2. Postsurgical hypothyroidism - latest thyroid labs reviewed with pt >> normal  - she continues on LT4 DAW 171mg daily - pt feels good on this dose. - we discussed about taking the thyroid hormone every day, with water, >30 minutes before breakfast, separated by >4 hours from acid reflux medications, calcium, iron, multivitamins. Pt. is taking it correctly - will check thyroid tests a few days after stopping biotin: TSH and fT4 - If labs are abnormal, she will need to return for repeat TFTs in 1.5 months - OTW, RTC in 1 year  Orders Placed This Encounter  Procedures  . Thyroglobulin Level  . Thyroglobulin antibody  . TSH  . T4, free   Needs refills.  CPhilemon Kingdom MD PhD [facility]

## 2017-06-10 NOTE — Patient Instructions (Addendum)
Please stop Biotin and come back for las in  1 week.  Please continue Synthroid 100 mcg daily.  Take the thyroid hormone every day, with water, at least 30 minutes before breakfast, separated by at least 4 hours from: - acid reflux medications - calcium - iron - multivitamins  Please return in 1 year.

## 2017-06-27 ENCOUNTER — Telehealth: Payer: Self-pay | Admitting: Internal Medicine

## 2017-06-27 NOTE — Telephone Encounter (Signed)
I have not seen any paperwork on my desk. I will check with Dr.Gherghe  Dr.Gherghe have you happened to see this?  Just checking.  Thanks!

## 2017-06-27 NOTE — Telephone Encounter (Signed)
Pt request confirmation ph call that we received from her ins her FMLA paperwork. Pt states they were to fax it here last week. Call pt [phone].

## 2017-06-27 NOTE — Telephone Encounter (Signed)
Pt called again checking status of FMLA forms being sent here. She says her employer told her on 06/10/17 they would be faxing it.

## 2017-06-27 NOTE — Telephone Encounter (Signed)
I think I did... If I did I placed it on your desk.

## 2017-06-28 ENCOUNTER — Telehealth: Payer: Self-pay

## 2017-06-28 NOTE — Telephone Encounter (Signed)
Called patient and advised that I had received paperwork, and filled out and faxed over. I advised patient to call them to see if they received it.  Left call back number for her to call and let us know.

## 2017-06-28 NOTE — Telephone Encounter (Signed)
Called and LVM advising patient that we had received the paperwork, it was filled out and faxed. I advised the original copy had been sent to scanning already, but for patient to call to see if they had received the paperwork, and if not for them to send it again I will fill back out.   Left call back number.

## 2017-07-04 ENCOUNTER — Other Ambulatory Visit (INDEPENDENT_AMBULATORY_CARE_PROVIDER_SITE_OTHER): Payer: 59

## 2017-07-04 DIAGNOSIS — E890 Postprocedural hypothyroidism: Secondary | ICD-10-CM | POA: Diagnosis not present

## 2017-07-04 DIAGNOSIS — C73 Malignant neoplasm of thyroid gland: Secondary | ICD-10-CM

## 2017-07-04 LAB — TSH: TSH: 0.49 u[IU]/mL (ref 0.35–4.50)

## 2017-07-04 LAB — T4, FREE: Free T4: 0.85 ng/dL (ref 0.60–1.60)

## 2017-07-05 LAB — THYROGLOBULIN ANTIBODY: Thyroglobulin Ab: <1 IU/mL (ref <=1)

## 2017-07-05 LAB — THYROGLOBULIN LEVEL: Thyroglobulin: <0.1 ng/mL — ABNORMAL LOW

## 2017-07-07 ENCOUNTER — Other Ambulatory Visit: Payer: Self-pay | Admitting: Internal Medicine

## 2017-07-07 ENCOUNTER — Other Ambulatory Visit: Payer: 59

## 2017-07-07 NOTE — Telephone Encounter (Signed)
Pt called and said pharmacy faxed [place] the prescription for DR to sign off and send back for refill  SYNTHROID 100 MCG tablet   Wants to know if we can send more then one refill over   Pt wants to go ahead and send that back over if we have it so she can go pick it up. She states she has been without it for awhile.

## 2018-03-19 DIAGNOSIS — R3989 Other symptoms and signs involving the genitourinary system: Secondary | ICD-10-CM | POA: Diagnosis not present

## 2018-03-19 DIAGNOSIS — M79605 Pain in left leg: Secondary | ICD-10-CM | POA: Diagnosis not present

## 2018-03-19 DIAGNOSIS — M545 Low back pain: Secondary | ICD-10-CM | POA: Diagnosis not present

## 2018-03-22 ENCOUNTER — Telehealth: Payer: Self-pay | Admitting: Emergency Medicine

## 2018-03-22 NOTE — Telephone Encounter (Signed)
error 

## 2018-03-26 DIAGNOSIS — N390 Urinary tract infection, site not specified: Secondary | ICD-10-CM | POA: Diagnosis not present

## 2018-03-26 DIAGNOSIS — M549 Dorsalgia, unspecified: Secondary | ICD-10-CM | POA: Diagnosis not present

## 2018-07-18 ENCOUNTER — Telehealth: Payer: Self-pay | Admitting: Internal Medicine

## 2018-07-18 NOTE — Telephone Encounter (Signed)
Pharmacy updated.  Patient last seen one year ago, does need appointment for her one year follow up.

## 2018-07-18 NOTE — Telephone Encounter (Signed)
Patient called to advise that [facility] [address]  Also wants to know if she needs an appointment with Dr Cruzita Lederer

## 2018-08-11 ENCOUNTER — Telehealth: Payer: Self-pay | Admitting: Internal Medicine

## 2018-08-11 NOTE — Telephone Encounter (Signed)
I am so sorry, I cannot remember. Possibly Dr. Harl Bowie.

## 2018-08-11 NOTE — Telephone Encounter (Signed)
Patient stated that Dr Cruzita Lederer gave her a number for a doctor for acid reflux and she would like to know if Dr Cruzita Lederer remembers the doctor and the number  Please advise

## 2018-08-15 ENCOUNTER — Telehealth: Payer: Self-pay | Admitting: Internal Medicine

## 2018-08-15 NOTE — Telephone Encounter (Signed)
[facility] is needing a new authorization for SYNTHROID 100 MCG tablet. Also, patient called last week about information and never heard back. Please Advise, thanks

## 2018-08-15 NOTE — Telephone Encounter (Signed)
[facility] has called stating that [id number] [place], [address]  is stating

## 2018-08-16 MED ORDER — SYNTHROID 100 MCG PO TABS: 100.0000 ug | ORAL_TABLET | Freq: Every day | ORAL | 3 refills | Status: DC

## 2018-08-16 NOTE — Telephone Encounter (Signed)
RX sent   Patient notified  

## 2018-08-21 ENCOUNTER — Encounter: Payer: Self-pay | Admitting: Gastroenterology

## 2018-09-04 DIAGNOSIS — N898 Other specified noninflammatory disorders of vagina: Secondary | ICD-10-CM | POA: Diagnosis not present

## 2018-09-04 DIAGNOSIS — Z1239 Encounter for other screening for malignant neoplasm of breast: Secondary | ICD-10-CM | POA: Diagnosis not present

## 2018-09-04 DIAGNOSIS — Z124 Encounter for screening for malignant neoplasm of cervix: Secondary | ICD-10-CM | POA: Diagnosis not present

## 2018-09-04 DIAGNOSIS — R69 Illness, unspecified: Secondary | ICD-10-CM | POA: Diagnosis not present

## 2018-09-04 DIAGNOSIS — Z1151 Encounter for screening for human papillomavirus (HPV): Secondary | ICD-10-CM | POA: Diagnosis not present

## 2018-09-04 DIAGNOSIS — Z01419 Encounter for gynecological examination (general) (routine) without abnormal findings: Secondary | ICD-10-CM | POA: Diagnosis not present

## 2018-09-04 DIAGNOSIS — Z0000 Encounter for general adult medical examination without abnormal findings: Secondary | ICD-10-CM | POA: Diagnosis not present

## 2018-09-04 DIAGNOSIS — Z1231 Encounter for screening mammogram for malignant neoplasm of breast: Secondary | ICD-10-CM | POA: Diagnosis not present

## 2018-09-07 ENCOUNTER — Telehealth: Payer: Self-pay | Admitting: Internal Medicine

## 2018-09-07 NOTE — Telephone Encounter (Signed)
MEDICATION: SYNTHROID 100 MCG tablet  PH[facility]ACY:  [facility] [address] [facility] [place]  IS THIS A 90 DAY SUPPLY : No  IS PATIENT OUT OF MEDICATION: Yes  IF NOT; HOW MUCH IS LEFT: None  LAST APPOINTMENT DATE: @11 /9/18  NEXT APPOINTMENT DATE:@Visit  date not found  DO WE HAVE YOUR PERMISSION TO LEAVE A DETAILED MESSAGE:Yes  OTHER COMMENTS: Patient is upset. Please change Pharmacy to the Pharmacy listed above. Patient needs RX sent sap   **Let patient know to contact pharmacy at the end of the day to make sure medication is ready. **  ** Please notify patient to allow 48-72 hours to process**  **Encourage patient to contact the pharmacy for refills or they can request refills through [facility]**

## 2018-09-08 MED ORDER — LEVOTHYROXINE SODIUM 100 MCG PO TABS: 100.0000 ug | ORAL_TABLET | Freq: Every day | ORAL | 3 refills | Status: DC

## 2018-09-08 NOTE — Telephone Encounter (Signed)
RX sent

## 2018-09-08 NOTE — Telephone Encounter (Signed)
Spoke with pharmacist at [facility] they are unable to extend her anymore to carry her thru the weekend but they did offer for her to pay out of pocket for a few pills until the PA is approved. Just awaiting

## 2018-09-08 NOTE — Telephone Encounter (Signed)
From my point of view he should not be any problem to switch to the levothyroxine generic if she wants to do this.  We can send the same dose generic if she agrees.

## 2018-09-08 NOTE — Telephone Encounter (Signed)
[facility] [address] for the patient to see if the can let her have enough to get her thru the weekend. Called x2 10:02 and at 11:07 so far and the phone is busy

## 2018-09-08 NOTE — Telephone Encounter (Signed)
Spoke with patient on 09/07/2018 for an extensive amount of time regarding this refill. I did apologize and let her know that I would be sure the pharmacy was changed to [facility], I did see where she has spoken with 2 of our employees asking for this rx to be sent to [place] but she states that is not true.   Pt explained to me about getting a call from the [place] location stating the rx was ready for pick up, she let them know its the wrong place and asked if they could send it to [place] location. They did and when she called that location they said it was not covered by insurance. I explained it is because the insurance company thinks she is trying to pick up the same script in 2 different pharmacies on the same day. I told the pt to call back to the pharmacy and then call me in the AM on 09/08/2018 to let me know what the outcome is  09/08/2018 at 10:15 Spoke with patient she spoke with [facility] and they are now stating that a PA is needed for the Synthroid  Please start this ASAP-[facility] was supposed to have [facility] [place] form for this.   Melissa please let me know when this is submitted

## 2018-09-08 NOTE — Telephone Encounter (Signed)
Spoke with the pharmacy again because we had not yet received the PA paperwork. This time Melinda Vazquez re-ran the rx and it is not a PA that is needed. The insurance company has made the brand name synthroid non formulary and now will only cover the generic levothyroxine. Is this able to be called in for her if so [facility] [address] as she is completely out of her medication.

## 2018-09-08 NOTE — Addendum Note (Signed)
Addended by: Cardell Peach I on: 09/08/2018 04:53 PM   Modules accepted: Orders

## 2018-09-11 ENCOUNTER — Ambulatory Visit: Payer: 59 | Admitting: Gastroenterology

## 2018-09-11 DIAGNOSIS — Z0100 Encounter for examination of eyes and vision without abnormal findings: Secondary | ICD-10-CM | POA: Diagnosis not present

## 2018-09-11 NOTE — Telephone Encounter (Signed)
Spoke with patient she was able to get her medication on Friday. Reasoning for the rx being called in as generic was given to patient

## 2018-09-22 ENCOUNTER — Encounter: Payer: Self-pay | Admitting: Gastroenterology

## 2018-09-22 ENCOUNTER — Ambulatory Visit (INDEPENDENT_AMBULATORY_CARE_PROVIDER_SITE_OTHER): Payer: 59 | Admitting: Gastroenterology

## 2018-09-22 ENCOUNTER — Encounter (INDEPENDENT_AMBULATORY_CARE_PROVIDER_SITE_OTHER): Payer: Self-pay

## 2018-09-22 VITALS — BP 160/104 | HR 72 | Ht 62.5 in | Wt 204.4 lb

## 2018-09-22 DIAGNOSIS — R196 Halitosis: Secondary | ICD-10-CM | POA: Diagnosis not present

## 2018-09-22 DIAGNOSIS — R1110 Vomiting, unspecified: Secondary | ICD-10-CM | POA: Diagnosis not present

## 2018-09-22 DIAGNOSIS — K219 Gastro-esophageal reflux disease without esophagitis: Secondary | ICD-10-CM

## 2018-09-22 DIAGNOSIS — Z0100 Encounter for examination of eyes and vision without abnormal findings: Secondary | ICD-10-CM | POA: Diagnosis not present

## 2018-09-22 MED ORDER — OMEPRAZOLE 20 MG PO CPDR: 20.0000 mg | DELAYED_RELEASE_CAPSULE | Freq: Every day | ORAL | 3 refills | Status: DC

## 2018-09-22 NOTE — Progress Notes (Signed)
Melinda Vazquez    [id number]    1966/02/09  Primary Care Physician:Duran, Otelia Limes, PA-C  Referring Physician: [name] [facility]  [place], [place] [address]  Chief complaint:  Altered taste sensation  HPI:  53 year old African-American female here with complaints of regurgitation and acid taste.  Her main complaint is abnormal sense of taste.  Complains of tasting food several hours after eating and having a bad breath.  Denies frequent belching, abdominal pain, nausea, vomiting, dysphagia, change in bowel habits or blood in stool  EGD April 2018 showed erosive esophagitis, gastritis, biopsies negative for Barrett's esophagus and H. Pylori Colonoscopy April 2018 with removal of hyperplastic polyp. Prior to that she had EGD and colonoscopy about 5 years ago at [facility], records not available.  Barium esophagram Dec 24, 2016- for mass or stricture in esophagus, small sliding-type hiatal hernia, no reflux was noted, barium tablet passed without significant delay or difficulty promptly through EG junction.  NSAID use intermittently as needed about once or twice a month  Sometimes take otc natural vitamins  She was prescribed Omeprazole 20mg  but doesn't think she took it for more than 2 weeks, doesn't think it made a difference.  Other relevant history includes papillary thyroid cancer status post surgery and radiation in 2016. Currently on thyroid replacement.     Outpatient Encounter Medications as of 09/22/2018  Medication Sig  . levothyroxine (SYNTHROID, LEVOTHROID) 100 MCG tablet Take 1 tablet (100 mcg total) by mouth daily.  Marland Kitchen acetaminophen (TYLENOL) 500 MG tablet Take 1,000 mg by mouth every 6 (six) hours as needed.  . Coenzyme Q10-Red Yeast Rice 60-600 MG CAPS Take 1 tablet by mouth 2 (two) times daily.  . cyclobenzaprine (FLEXERIL) 10 MG tablet Take 10 mg by mouth.   Marland Kitchen ibuprofen (ADVIL,MOTRIN) 200 MG tablet  Take 200-400 mg by mouth every 6 (six) hours as needed for headache or moderate pain.  . methocarbamol (ROBAXIN) 750 MG tablet Take 750 mg by mouth as needed.   . Multiple Vitamin (MULTIVITAMIN WITH MINERALS) TABS tablet Take 1 tablet by mouth every morning.  . naproxen (NAPROSYN) 500 MG tablet Take 500 mg by mouth as needed.   Marland Kitchen OVER THE COUNTER MEDICATION Take 10 mLs by mouth every morning. Braggs Vinegar  . OVER THE COUNTER MEDICATION Take 1 tablet by mouth daily. Bone strength vitamin  . OVER THE COUNTER MEDICATION Take 1 tablet by mouth 2 (two) times daily. PM Phytogen Complex  . Tetrahydrozoline HCl (VISINE OP) Apply 1-2 drops to eye daily as needed (red dry eyes.).  Marland Kitchen traMADol (ULTRAM) 50 MG tablet Take 50 mg by mouth as needed.    No facility-administered encounter medications on file as of 09/22/2018.     Allergies as of 09/22/2018  . (No Known Allergies)    Past Medical History:  Diagnosis Date  . Acute back pain   . Acute hip pain   . Anemia    hx of  . Cancer ([place]) 10/2014   papillary thyroid carcinoma-radiation after surgery  . Colon polyp   . Degenerative disc disease, lumbar    L5-S1  . Fatigue   . Hyperlipidemia   . Osteoporosis   . Thyroid mass   . Thyroid nodule   . Vitamin D deficiency     Past Surgical History:  Procedure Laterality Date  . BIOPSY THYROID Bilateral 10/2014  . COLONOSCOPY  01/24/14  .  ECTOPIC PREGNANCY SURGERY  1998  . ENDOMETRIAL ABLATION  2010  . LYMPH NODE DISSECTION N/A 11/29/2014   Procedure: LIMITED LYMPH NODE DISSECTION;  Surgeon: Armandina Gemma, MD;  Location: [facility];  Service: General;  Laterality: N/A;  . SALPINGOOPHORECTOMY     one side  . THYROIDECTOMY N/A 11/29/2014   Procedure: TOTAL THYROIDECTOMY LIMITED LYMPH NODE DISSECTION;  Surgeon: Armandina Gemma, MD;  Location: [facility];  Service: General;  Laterality: N/A;  . TUBAL LIGATION  2011    Family History  Problem Relation Age of Onset  . Diabetes Mother        DM Type II   .  Hyperlipidemia Mother   . Hypertension Mother   . Diabetes Father        DM Type II   . Hyperlipidemia Father   . Heart disease Father   . Hypertension Father     Social History   Socioeconomic History  . Marital status: Single    Spouse name: Not on file  . Number of children: Not on file  . Years of education: Not on file  . Highest education level: Not on file  Occupational History  . Not on file  Social Needs  . Financial resource strain: Not on file  . Food insecurity:    Worry: Not on file    Inability: Not on file  . Transportation needs:    Medical: Not on file    Non-medical: Not on file  Tobacco Use  . Smoking status: Current Some Day Smoker    Types: Cigarettes  . Smokeless tobacco: Never Used  . Tobacco comment: social smoker  Substance and Sexual Activity  . Alcohol use: Yes    Comment: social  . Drug use: No  . Sexual activity: Yes    Birth control/protection: Surgical  Lifestyle  . Physical activity:    Days per week: Not on file    Minutes per session: Not on file  . Stress: Not on file  Relationships  . Social connections:    Talks on phone: Not on file    Gets together: Not on file    Attends religious service: Not on file    Active member of club or organization: Not on file    Attends meetings of clubs or organizations: Not on file    Relationship status: Not on file  . Intimate partner violence:    Fear of current or ex partner: Not on file    Emotionally abused: Not on file    Physically abused: Not on file    Forced sexual activity: Not on file  Other Topics Concern  . Not on file  Social History Narrative   Bone Density Study: 09/06/13   Pap Smear: 12/2012   Mammogram: 09/2012      Review of systems: Review of Systems  Constitutional: Negative for fever and chills.  HENT: Negative.   Eyes: Negative for blurred vision.  Respiratory: Negative for cough, shortness of breath and wheezing.   Cardiovascular: Negative for chest pain  and palpitations.  Gastrointestinal: as per HPI Genitourinary: Negative for dysuria, urgency, frequency and hematuria.  Musculoskeletal: Negative for myalgias, back pain and joint pain.  Skin: Negative for itching and rash.  Neurological: Negative for dizziness, tremors, focal weakness, seizures and loss of consciousness.  Endo/Heme/Allergies: Positive for seasonal allergies.  Psychiatric/Behavioral: Negative for depression, suicidal ideas and hallucinations.  All other systems reviewed and are negative.   Physical Exam: Vitals:   09/22/18 1309  BP: (!) 160/104  Pulse: 72   Body mass index is 36.78 kg/m. Gen:      No acute distress HEENT:  EOMI, sclera anicteric Neck:     No masses; no thyromegaly Lungs:    Clear to auscultation bilaterally; normal respiratory effort CV:         Regular rate and rhythm; no murmurs Abd:      + bowel sounds; soft, non-tender; no palpable masses, no distension Ext:    No edema; adequate peripheral perfusion Skin:      Warm and dry; no rash Neuro: alert and oriented x 3 Psych: normal mood and affect  Data Reviewed:  Reviewed labs, radiology imaging, old records and pertinent past GI work up   Assessment and Plan/Recommendations:  53 year old female with history of papillary thyroid carcinoma status post surgery and radiation therapy, obesity here with complaints of bad breath, tasting food several hours after a meal and altered taste sensation.  EGD and barium esophagram negative for esophageal stricture, esophagitis or large hiatal hernia  Discussed antireflux measures Will do a trial of PPI, omeprazole 20 mg daily 30 minutes before dinner  Obtain 4-hour gastric emptying scan to exclude gastroparesis causing potentially bad breath and?  Regurgitation  Altered sensation of taste could be secondary to radiation therapy.  If above work-up negative and continues to have persistent symptoms, will refer to ENT.    Damaris Hippo ,  MD [phone]    CC: Secundino Ginger, PA-C

## 2018-09-22 NOTE — Patient Instructions (Signed)
You have been scheduled for a gastric emptying scan at [facility] on 10/04/2018 at 7:30am. Please arrive at least 15 minutes prior to your appointment for registration. Please make certain not to have anything to eat or drink after midnight the night before your test. Hold all stomach medications (ex: Zofran, phenergan, Reglan) 48 hours prior to your test. If you need to reschedule your appointment, please contact radiology scheduling at [phone]. _____________________________________________________________________ A gastric-emptying study measures how long it takes for food to move through your stomach. There are several ways to measure stomach emptying. In the most common test, you eat food that contains a small amount of radioactive material. A scanner that detects the movement of the radioactive material is placed over your abdomen to monitor the rate at which food leaves your stomach. This test normally takes about 4 hours to complete. _____________________________________________________________________   Eat Small Frequent meals  AVOID High Fiber and high fat foods  Take Omeprazole 20 mg daily   Gastroesophageal Reflux Disease, Adult Gastroesophageal reflux (GER) happens when acid from the stomach flows up into the tube that connects the mouth and the stomach (esophagus). Normally, food travels down the esophagus and stays in the stomach to be digested. However, when a person has GER, food and stomach acid sometimes move back up into the esophagus. If this becomes a more serious problem, the person may be diagnosed with a disease called gastroesophageal reflux disease (GERD). GERD occurs when the reflux:  Happens often.  Causes frequent or severe symptoms.  Causes problems such as damage to the esophagus. When stomach acid comes in contact with the esophagus, the acid may cause soreness (inflammation) in the esophagus. Over time, GERD may create small holes (ulcers) in the  lining of the esophagus. What are the causes? This condition is caused by a problem with the muscle between the esophagus and the stomach (lower esophageal sphincter, or LES). Normally, the LES muscle closes after food passes through the esophagus to the stomach. When the LES is weakened or abnormal, it does not close properly, and that allows food and stomach acid to go back up into the esophagus. The LES can be weakened by certain dietary substances, medicines, and medical conditions, including:  Tobacco use.  Pregnancy.  Having a hiatal hernia.  Alcohol use.  Certain foods and beverages, such as coffee, chocolate, onions, and peppermint. What increases the risk? You are more likely to develop this condition if you:  Have an increased body weight.  Have a connective tissue disorder.  Use NSAID medicines. What are the signs or symptoms? Symptoms of this condition include:  Heartburn.  Difficult or painful swallowing.  The feeling of having a lump in the throat.  Abitter taste in the mouth.  Bad breath.  Having a large amount of saliva.  Having an upset or bloated stomach.  Belching.  Chest pain. Different conditions can cause chest pain. Make sure you see your health care provider if you experience chest pain.  Shortness of breath or wheezing.  Ongoing (chronic) cough or a night-time cough.  Wearing away of tooth enamel.  Weight loss. How is this diagnosed? Your health care provider will take a medical history and perform a physical exam. To determine if you have mild or severe GERD, your health care provider may also monitor how you respond to treatment. You may also have tests, including:  A test to examine your stomach and esophagus with a small camera (endoscopy).  A test thatmeasures the  acidity level in your esophagus.  A test thatmeasures how much pressure is on your esophagus.  A barium swallow or modified barium swallow test to show the shape,  size, and functioning of your esophagus. How is this treated? The goal of treatment is to help relieve your symptoms and to prevent complications. Treatment for this condition may vary depending on how severe your symptoms are. Your health care provider may recommend:  Changes to your diet.  Medicine.  Surgery. Follow these instructions at home: Eating and drinking   Follow a diet as recommended by your health care provider. This may involve avoiding foods and drinks such as: ? Coffee and tea (with or without caffeine). ? Drinks that containalcohol. ? Energy drinks and sports drinks. ? Carbonated drinks or sodas. ? Chocolate and cocoa. ? Peppermint and mint flavorings. ? Garlic and onions. ? Horseradish. ? Spicy and acidic foods, including peppers, chili powder, curry powder, vinegar, hot sauces, and barbecue sauce. ? Citrus fruit juices and citrus fruits, such as oranges, lemons, and limes. ? Tomato-based foods, such as red sauce, chili, salsa, and pizza with red sauce. ? Fried and fatty foods, such as donuts, french fries, potato chips, and high-fat dressings. ? High-fat meats, such as hot dogs and fatty cuts of red and white meats, such as rib eye steak, sausage, ham, and bacon. ? High-fat dairy items, such as whole milk, butter, and cream cheese.  Eat small, frequent meals instead of large meals.  Avoid drinking large amounts of liquid with your meals.  Avoid eating meals during the 2-3 hours before bedtime.  Avoid lying down right after you eat.  Do not exercise right after you eat. Lifestyle   Do not use any products that contain nicotine or tobacco, such as cigarettes, e-cigarettes, and chewing tobacco. If you need help quitting, ask your health care provider.  Try to reduce your stress by using methods such as yoga or meditation. If you need help reducing stress, ask your health care provider.  If you are overweight, reduce your weight to an amount that is  healthy for you. Ask your health care provider for guidance about a safe weight loss goal. General instructions  Pay attention to any changes in your symptoms.  Take over-the-counter and prescription medicines only as told by your health care provider. Do not take aspirin, ibuprofen, or other NSAIDs unless your health care provider told you to do so.  Wear loose-fitting clothing. Do not wear anything tight around your waist that causes pressure on your abdomen.  Raise (elevate) the head of your bed about 6 inches (15 cm).  Avoid bending over if this makes your symptoms worse.  Keep all follow-up visits as told by your health care provider. This is important. Contact a health care provider if:  You have: ? New symptoms. ? Unexplained weight loss. ? Difficulty swallowing or it hurts to swallow. ? Wheezing or a persistent cough. ? A hoarse voice.  Your symptoms do not improve with treatment. Get help right away if you:  Have pain in your arms, neck, jaw, teeth, or back.  Feel sweaty, dizzy, or light-headed.  Have chest pain or shortness of breath.  Vomit and your vomit looks like blood or coffee grounds.  Faint.  Have stool that is bloody or black.  Cannot swallow, drink, or eat. Summary  Gastroesophageal reflux happens when acid from the stomach flows up into the esophagus. GERD is a disease in which the reflux happens often, causes frequent  or severe symptoms, or causes problems such as damage to the esophagus.  Treatment for this condition may vary depending on how severe your symptoms are. Your health care provider may recommend diet and lifestyle changes, medicine, or surgery.  Contact a health care provider if you have new or worsening symptoms.  Take over-the-counter and prescription medicines only as told by your health care provider. Do not take aspirin, ibuprofen, or other NSAIDs unless your health care provider told you to do so.  Keep all follow-up visits as  told by your health care provider. This is important. This information is not intended to replace advice given to you by your health care provider. Make sure you discuss any questions you have with your health care provider. Document Released: 04/28/2005 Document Revised: 01/25/2018 Document Reviewed: 01/25/2018 Elsevier Interactive Patient Education  2019 [employer].

## 2018-09-25 ENCOUNTER — Encounter: Payer: Self-pay | Admitting: Gastroenterology

## 2018-09-29 ENCOUNTER — Telehealth: Payer: Self-pay | Admitting: Gastroenterology

## 2018-09-29 NOTE — Telephone Encounter (Signed)
Cancelled per the patient request. Left her a message to call me and I would get her rescheduled.

## 2018-09-29 NOTE — Telephone Encounter (Signed)
Pt has a mandatory training at work.Marland Kitchen and will not be about to come to her appt at [facility] on 10-04-18 2 7:30am. Would like to cancel.

## 2018-09-29 NOTE — Telephone Encounter (Signed)
Patient states that she was going to get some vitamin samples the day of her visit on 2-21 and never received anything. Would like to know if she could come and pick them up.

## 2018-09-29 NOTE — Telephone Encounter (Signed)
Dr Silverio Decamp what vitamin samples is patient referring to?

## 2018-10-04 ENCOUNTER — Encounter (HOSPITAL_COMMUNITY): Admission: RE | Admit: 2018-10-04 | Payer: 59 | Source: Ambulatory Visit

## 2018-10-10 NOTE — Telephone Encounter (Signed)
Not sure what vitamins patient is referring to.  We can give her samples of FD guard if available for dyspepsia.  Thank you

## 2018-10-11 NOTE — Telephone Encounter (Signed)
Pt aware--will pick up on Friday.

## 2018-10-11 NOTE — Telephone Encounter (Signed)
I have FDGard samples here for patient ready to pick up, called patient to leave message but no voice mail came on

## 2018-10-16 ENCOUNTER — Telehealth: Payer: Self-pay | Admitting: Gastroenterology

## 2018-10-16 NOTE — Telephone Encounter (Signed)
Pt called in wanting to ask the doctor if the medication that she prescribe her have an effect of the Kidney's?

## 2018-10-17 NOTE — Telephone Encounter (Signed)
Left information on the voicemail. 

## 2018-10-17 NOTE — Telephone Encounter (Signed)
Left message to call back to discuss.

## 2018-10-17 NOTE — Telephone Encounter (Signed)
Patient does not have CKD. She is not on HTN or DM medications. She states she just tries to be careful. She states she does not have a specific question about the medication, just in general is it harmful to her kidneys?

## 2018-10-17 NOTE — Telephone Encounter (Signed)
Which medication is patient referring to ? I prescribed low dose Omeprazole 20mg  and also advised to take FD Gard prn which is OTC and both do not have any significant injury to kidney

## 2019-02-26 DIAGNOSIS — M545 Low back pain: Secondary | ICD-10-CM | POA: Diagnosis not present

## 2019-02-26 DIAGNOSIS — M79605 Pain in left leg: Secondary | ICD-10-CM | POA: Diagnosis not present

## 2019-02-26 DIAGNOSIS — E8881 Metabolic syndrome: Secondary | ICD-10-CM | POA: Diagnosis not present

## 2019-02-26 DIAGNOSIS — M62830 Muscle spasm of back: Secondary | ICD-10-CM | POA: Diagnosis not present

## 2019-03-09 DIAGNOSIS — I10 Essential (primary) hypertension: Secondary | ICD-10-CM | POA: Diagnosis not present

## 2019-03-09 DIAGNOSIS — N183 Chronic kidney disease, stage 3 (moderate): Secondary | ICD-10-CM | POA: Diagnosis not present

## 2019-03-09 DIAGNOSIS — M545 Low back pain: Secondary | ICD-10-CM | POA: Diagnosis not present

## 2019-03-09 DIAGNOSIS — N62 Hypertrophy of breast: Secondary | ICD-10-CM | POA: Diagnosis not present

## 2019-03-09 DIAGNOSIS — M5136 Other intervertebral disc degeneration, lumbar region: Secondary | ICD-10-CM | POA: Diagnosis not present

## 2019-03-23 DIAGNOSIS — M5136 Other intervertebral disc degeneration, lumbar region: Secondary | ICD-10-CM | POA: Diagnosis not present

## 2019-03-23 DIAGNOSIS — M5137 Other intervertebral disc degeneration, lumbosacral region: Secondary | ICD-10-CM | POA: Diagnosis not present

## 2019-03-23 DIAGNOSIS — N62 Hypertrophy of breast: Secondary | ICD-10-CM | POA: Diagnosis not present

## 2019-03-23 DIAGNOSIS — I10 Essential (primary) hypertension: Secondary | ICD-10-CM | POA: Diagnosis not present

## 2019-04-13 DIAGNOSIS — M4316 Spondylolisthesis, lumbar region: Secondary | ICD-10-CM | POA: Diagnosis not present

## 2019-04-13 DIAGNOSIS — M545 Low back pain: Secondary | ICD-10-CM | POA: Diagnosis not present

## 2019-04-13 DIAGNOSIS — M5136 Other intervertebral disc degeneration, lumbar region: Secondary | ICD-10-CM | POA: Diagnosis not present

## 2019-04-16 ENCOUNTER — Other Ambulatory Visit: Payer: Self-pay | Admitting: Hematology

## 2019-04-16 DIAGNOSIS — C73 Malignant neoplasm of thyroid gland: Secondary | ICD-10-CM

## 2019-04-16 DIAGNOSIS — D72819 Decreased white blood cell count, unspecified: Secondary | ICD-10-CM | POA: Insufficient documentation

## 2019-04-16 NOTE — Progress Notes (Deleted)
[facility] CONSULT NOTE  Patient Care Team: [name] as PCP - General (Cardiology)  HEME/ONC OVERVIEW: 1. Hx of papillary thyroid cancer  -11/2014: total thyroidectomy; path showed papillary thyroid carcinoma, follicular variant, margins neg, 0/7 LN +, pT2pN0 -03/2015: post-thyroidectomy RAI -Currently in remission   2. Mild leukopenia  -WBC mid-3k's since 2020   TREATMENT REGIMEN:  11/26/2014: total thyroidectomy with regional LN dissection, Dr. Harlow Asa  03/17/2015: post-thyroidectomy RAI   ASSESSMENT & PLAN:   Mild leukopenia  -I reviewed the patient's records in detail, including PCP clinic notes, lab studies, imaging results and pathology reports -In summary, patient was diagnosed with papillary thyroid carcinoma in 2016, for which she underwent total thyroidectomy with regional LN dissection. Path showed papillary thyroid carcinoma, margins negative, pT2pN0. She underwent post-thyroidectomy RAI in 03/2015 and has remained NED. Her WBC was low normal WBC (4-5k) in 2016. Labs have been scant since 2016, but review of her recent labs in 2020 showed WBC in the mid-3k's w/ PMN 35% and lymphs 55%.  -Clinically, patient denies any constitutional symptoms, recent infection or medication changes -WBC ___ today -I personally reviewed the patient's peripheral blood smear, which showed _____ -I discussed in detail with the patient some of the potential causes of mild leukopenia, including medications, infection, myelosuppression from RAI and ethnicity -RAI can cause mild bone marrow suppression, and even myelodysplastic syndrome; however, her leukopenia is very mild and there is no other cytopenia, so she would only require periodic monitoring, and bone marrow biopsy is unlikely to change the management -In addition, patient already had low normal WBC in 2016, and as African American patients can have mild benign leukopenia, it may also contribute to the mild  leukopenia -I have ordered nutritional studies, including B12, folate, and copper, as well as infectious studies, including HIV, Hep B/C serologies -Furthermore, I have ordered flow cytometry to rule out any monoclonal process, due to the slight lymphocytic predominance  -If the above work-up is unremarkable, then I do not think there is an urgent indication to pursue any further work-up, such as bone marrow biopsy, unless the patient develops progressively worsening leukopenia, or concurrent anemia or thrombocytopenia -Finally, I counseled the patient on any concerning symptoms, such as persistent fever, night sweats, unexplained weight loss, enlarging lymph nodes, or abnormal bleeding/bruising, for which she should seek further evaluation   No orders of the defined types were placed in this encounter.   A total of more than {CHL ONC TIME VISIT - BJSEG:[id number]} were spent face-to-face with the patient during this encounter and over half of that time was spent on counseling and coordination of care as outlined above.    All questions were answered. The patient knows to call the clinic with any problems, questions or concerns.  Tish Men, MD 04/16/2019 11:52 AM   CHIEF COMPLAINTS/PURPOSE OF CONSULTATION:  "I am here for ***"  HISTORY OF PRESENTING ILLNESS:  Melinda Vazquez 53 y.o. female is here because of mild leukopenia. Patient was diagnosed with papillary thyroid carcinoma in 2016, for which she underwent total thyroidectomy with regional LN dissection. Path showed papillary thyroid carcinoma, margins negative, pT2pN0. She underwent post-thyroidectomy RAI in 03/2015 and has remained NED. Her WBC was low normal WBC (4-5k) in 2016. Labs have been scant since 2016, but review of her recent labs in 2020 showed WBC in the mid-3k's w/ PMN 35% and lymphs 55%.   I have reviewed her chart and materials related to her cancer extensively and  collaborated history with the patient. Summary of oncologic  history is as follows: Oncology History   No history exists.    MEDICAL HISTORY:  Past Medical History:  Diagnosis Date  . Acute back pain   . Acute hip pain   . Anemia    hx of  . Cancer ([place]) 10/2014   papillary thyroid carcinoma-radiation after surgery  . Colon polyp   . Degenerative disc disease, lumbar    L5-S1  . Fatigue   . Hyperlipidemia   . Osteoporosis   . Thyroid mass   . Thyroid nodule   . Vitamin D deficiency     SURGICAL HISTORY: Past Surgical History:  Procedure Laterality Date  . BIOPSY THYROID Bilateral 10/2014  . COLONOSCOPY  01/24/14  . ECTOPIC PREGNANCY SURGERY  1998  . ENDOMETRIAL ABLATION  2010  . LYMPH NODE DISSECTION N/A 11/29/2014   Procedure: LIMITED LYMPH NODE DISSECTION;  Surgeon: Armandina Gemma, MD;  Location: [facility];  Service: General;  Laterality: N/A;  . SALPINGOOPHORECTOMY     one side  . THYROIDECTOMY N/A 11/29/2014   Procedure: TOTAL THYROIDECTOMY LIMITED LYMPH NODE DISSECTION;  Surgeon: Armandina Gemma, MD;  Location: [facility];  Service: General;  Laterality: N/A;  . TUBAL LIGATION  2011    SOCIAL HISTORY: Social History   Socioeconomic History  . Marital status: Single    Spouse name: Not on file  . Number of children: 2  . Years of education: Not on file  . Highest education level: Not on file  Occupational History  . Occupation: Therapist, art rep  Social Needs  . Financial resource strain: Not on file  . Food insecurity    Worry: Not on file    Inability: Not on file  . Transportation needs    Medical: Not on file    Non-medical: Not on file  Tobacco Use  . Smoking status: Former Smoker    Types: Cigarettes    Quit date: 09/23/2015    Years since quitting: 3.5  . Smokeless tobacco: Never Used  . Tobacco comment: social smoker  Substance and Sexual Activity  . Alcohol use: Yes    Comment: social wine  . Drug use: No  . Sexual activity: Yes    Birth control/protection: Surgical  Lifestyle  . Physical activity    Days  per week: Not on file    Minutes per session: Not on file  . Stress: Not on file  Relationships  . Social Herbalist on phone: Not on file    Gets together: Not on file    Attends religious service: Not on file    Active member of club or organization: Not on file    Attends meetings of clubs or organizations: Not on file    Relationship status: Not on file  . Intimate partner violence    Fear of current or ex partner: Not on file    Emotionally abused: Not on file    Physically abused: Not on file    Forced sexual activity: Not on file  Other Topics Concern  . Not on file  Social History Narrative   Bone Density Study: 09/06/13   Pap Smear: 12/2012   Mammogram: 09/2012    FAMILY HISTORY: Family History  Problem Relation Age of Onset  . Diabetes Mother        DM Type II   . Hyperlipidemia Mother   . Hypertension Mother   . Diabetes Father  DM Type II   . Hyperlipidemia Father   . Heart disease Father   . Hypertension Father     ALLERGIES:  has No Known Allergies.  MEDICATIONS:  Current Outpatient Medications  Medication Sig Dispense Refill  . acetaminophen (TYLENOL) 500 MG tablet Take 1,000 mg by mouth as needed.     . Coenzyme Q10-Red Yeast Rice 60-600 MG CAPS Take 1 tablet by mouth 2 (two) times daily.    . cyclobenzaprine (FLEXERIL) 10 MG tablet Take 10 mg by mouth.     Marland Kitchen ibuprofen (ADVIL,MOTRIN) 200 MG tablet Take 200-400 mg by mouth as needed for headache or moderate pain.     Marland Kitchen levothyroxine (SYNTHROID, LEVOTHROID) 100 MCG tablet Take 1 tablet (100 mcg total) by mouth daily. 90 tablet 3  . methocarbamol (ROBAXIN) 750 MG tablet Take 750 mg by mouth as needed.     . Multiple Vitamin (MULTIVITAMIN WITH MINERALS) TABS tablet Take 1 tablet by mouth every morning.    . naproxen (NAPROSYN) 500 MG tablet Take 500 mg by mouth as needed.     Marland Kitchen omeprazole (PRILOSEC) 20 MG capsule Take 1 capsule (20 mg total) by mouth daily. 90 capsule 3  . OVER THE COUNTER  MEDICATION Take 10 mLs by mouth every morning. Braggs Vinegar    . OVER THE COUNTER MEDICATION Take 1 tablet by mouth daily. Bone strength vitamin    . OVER THE COUNTER MEDICATION Take 1 tablet by mouth 2 (two) times daily. PM Phytogen Complex    . Tetrahydrozoline HCl (VISINE OP) Apply 1-2 drops to eye daily as needed (red dry eyes.).    Marland Kitchen traMADol (ULTRAM) 50 MG tablet Take 50 mg by mouth as needed.      No current facility-administered medications for this visit.     REVIEW OF SYSTEMS:   Constitutional: ( - ) fevers, ( - )  chills , ( - ) night sweats Eyes: ( - ) blurriness of vision, ( - ) double vision, ( - ) watery eyes Ears, nose, mouth, throat, and face: ( - ) mucositis, ( - ) sore throat Respiratory: ( - ) cough, ( - ) dyspnea, ( - ) wheezes Cardiovascular: ( - ) palpitation, ( - ) chest discomfort, ( - ) lower extremity swelling Gastrointestinal:  ( - ) nausea, ( - ) heartburn, ( - ) change in bowel habits Skin: ( - ) abnormal skin rashes Lymphatics: ( - ) new lymphadenopathy, ( - ) easy bruising Neurological: ( - ) numbness, ( - ) tingling, ( - ) new weaknesses Behavioral/Psych: ( - ) mood change, ( - ) new changes  All other systems were reviewed with the patient and are negative.  PHYSICAL EXAMINATION: ECOG PERFORMANCE STATUS: {CHL ONC ECOG PS:[id number]}  There were no vitals filed for this visit. There were no vitals filed for this visit.  GENERAL: alert, no distress and comfortable SKIN: skin color, texture, turgor are normal, no rashes or significant lesions EYES: conjunctiva are pink and non-injected, sclera clear OROPHARYNX: no exudate, no erythema; lips, buccal mucosa, and tongue normal  NECK: supple, non-tender LYMPH:  no palpable lymphadenopathy in the cervical LUNGS: clear to auscultation with normal breathing effort HEART: regular rate & rhythm, no murmurs, no lower extremity edema ABDOMEN: soft, non-tender, non-distended, normal bowel  sounds Musculoskeletal: no cyanosis of digits and no clubbing  PSYCH: alert & oriented x 3, fluent speech NEURO: no focal motor/sensory deficits  LABORATORY DATA:  I have reviewed the data as listed  Lab Results  Component Value Date   WBC 4.0 11/25/2014   HGB 13.8 11/25/2014   HCT 42.4 11/25/2014   MCV 87.8 11/25/2014   PLT 220 11/25/2014   Lab Results  Component Value Date   NA 142 11/30/2014   K 3.8 11/30/2014   CL 106 11/30/2014   CO2 27 11/30/2014    RADIOGRAPHIC STUDIES: I have personally reviewed the radiological images as listed and agreed with the findings in the report. No results found.  PATHOLOGY: I have reviewed the pathology reports as documented in the oncologist history.

## 2019-04-23 NOTE — Progress Notes (Signed)
[facility] CONSULT NOTE  Patient Care Team: [name] as PCP - General (Cardiology)  HEME/ONC OVERVIEW: 1. Hx of papillary thyroid cancer  -11/2014: total thyroidectomy; path showed papillary thyroid carcinoma, follicular variant, margins neg, 0/7 LN +, pT2pN0 -03/2015: post-thyroidectomy RAI -Currently in remission   2. Mild leukopenia  -WBC mid-3k's since 2020  -Observation   TREATMENT REGIMEN:  11/26/2014: total thyroidectomy with regional LN dissection, Dr. Harlow Asa  03/17/2015: post-thyroidectomy RAI   ASSESSMENT & PLAN:   Mild leukopenia  -I reviewed the patient's records in detail, including PCP clinic notes, lab studies, imaging results and pathology reports -In summary, patient was diagnosed with papillary thyroid carcinoma in 2016, for which she underwent total thyroidectomy with regional LN dissection. Path showed papillary thyroid carcinoma, margins negative, pT2pN0. She underwent post-thyroidectomy RAI in 03/2015 and has remained NED. Her WBC was low normal WBC (4-5k) in 2016. Labs have been scant since 2016, but review of her recent labs in 2020 showed WBC in the mid-3k's w/ PMN 35% and lymphs 55%.  -Clinically, patient denies any constitutional symptoms, recent infection or medication changes -WBC 3.3k with ANC 1300 today -I personally reviewed the patient's peripheral blood smear today.  The red blood cells were of normal morphology.  There was no schistocytosis.  The white blood cells were of normal morphology. There were no peripheral circulating blasts. The platelets were of normal size and I verified that there were no platelet clumping. -I discussed in detail with the patient some of the potential causes of mild leukopenia, including medications, infection, myelosuppression from RAI and ethnicity -RAI can cause mild bone marrow suppression, and even myelodysplastic syndrome; however, her leukopenia is very mild and there is no other  cytopenia, so she would only require periodic monitoring, and bone marrow biopsy is unlikely to change the management -In addition, patient already had low normal WBC in 2016, and as African American patients can have mild benign leukopenia, it may also contribute to the mild leukopenia -I have ordered nutritional studies, including B12, folate, and copper, as well as infectious studies, including HIV, Hep B/C serologies -Furthermore, I have ordered flow cytometry to rule out any monoclonal process, due to the slight lymphocytic predominance  -If the above work-up is unremarkable, then I do not think there is an urgent indication to pursue any further work-up, such as bone marrow biopsy, unless the patient develops progressively worsening leukopenia, or concurrent anemia or thrombocytopenia -Finally, I counseled the patient on any concerning symptoms, such as persistent fever, night sweats, unexplained weight loss, enlarging lymph nodes, or abnormal bleeding/bruising, for which she should seek further evaluation   All questions were answered. The patient knows to call the clinic with any problems, questions or concerns.  Return in 1 year for labs and clinic follow-up, sooner as needed.   Melinda Men, MD  CHIEF COMPLAINTS/PURPOSE OF CONSULTATION:  "I am feeling fine*"  HISTORY OF PRESENTING ILLNESS:  Melinda Vazquez 53 y.o. female is here because of mild leukopenia. Patient was diagnosed with papillary thyroid carcinoma in 2016, for which she underwent total thyroidectomy with regional LN dissection. Path showed papillary thyroid carcinoma, margins negative, pT2pN0. She underwent post-thyroidectomy RAI in 03/2015 and has remained NED. Her WBC was low normal WBC (4-5k) in 2016. Labs have been scant since 2016, but review of her recent labs in 2020 showed WBC in the mid-3k's w/ PMN 35% and lymphs 55%.  Patient's PCP felt that the mild leukopenia might be related to  her ethnicity, but the patient requested to  have hematology referral for further evaluation.  Patient reports that she feels well, and denies any constitutional symptoms, chest pain, dyspnea, abdominal pain, nausea, vomiting, diarrhea, or abnormal bleeding/bruising.  She denies any history of frequent or severe infection that required IV antibiotics or hospitalization.  She denies any personal or family history of immunologic disorder.  She is up-to-date with her cancer screening, including colonoscopy, Pap smear, and mammogram.  She denies any complaint today.  MEDICAL HISTORY:  Past Medical History:  Diagnosis Date  . Acute back pain   . Acute hip pain   . Anemia    hx of  . Cancer ([place]) 10/2014   papillary thyroid carcinoma-radiation after surgery  . Colon polyp   . Degenerative disc disease, lumbar    L5-S1  . Fatigue   . Hyperlipidemia   . Osteoporosis   . Thyroid mass   . Thyroid nodule   . Vitamin D deficiency     SURGICAL HISTORY: Past Surgical History:  Procedure Laterality Date  . BIOPSY THYROID Bilateral 10/2014  . COLONOSCOPY  01/24/14  . ECTOPIC PREGNANCY SURGERY  1998  . ENDOMETRIAL ABLATION  2010  . LYMPH NODE DISSECTION N/A 11/29/2014   Procedure: LIMITED LYMPH NODE DISSECTION;  Surgeon: Armandina Gemma, MD;  Location: [facility];  Service: General;  Laterality: N/A;  . SALPINGOOPHORECTOMY     one side  . THYROIDECTOMY N/A 11/29/2014   Procedure: TOTAL THYROIDECTOMY LIMITED LYMPH NODE DISSECTION;  Surgeon: Armandina Gemma, MD;  Location: [facility];  Service: General;  Laterality: N/A;  . TUBAL LIGATION  2011    SOCIAL HISTORY: Social History   Socioeconomic History  . Marital status: Single    Spouse name: Not on file  . Number of children: 2  . Years of education: Not on file  . Highest education level: Not on file  Occupational History  . Occupation: Therapist, art rep  Social Needs  . Financial resource strain: Not on file  . Food insecurity    Worry: Not on file    Inability: Not on file  .  Transportation needs    Medical: Not on file    Non-medical: Not on file  Tobacco Use  . Smoking status: Former Smoker    Types: Cigarettes    Quit date: 09/23/2015    Years since quitting: 3.5  . Smokeless tobacco: Never Used  . Tobacco comment: social smoker  Substance and Sexual Activity  . Alcohol use: Yes    Comment: social wine  . Drug use: No  . Sexual activity: Yes    Birth control/protection: Surgical  Lifestyle  . Physical activity    Days per week: Not on file    Minutes per session: Not on file  . Stress: Not on file  Relationships  . Social Herbalist on phone: Not on file    Gets together: Not on file    Attends religious service: Not on file    Active member of club or organization: Not on file    Attends meetings of clubs or organizations: Not on file    Relationship status: Not on file  . Intimate partner violence    Fear of current or ex partner: Not on file    Emotionally abused: Not on file    Physically abused: Not on file    Forced sexual activity: Not on file  Other Topics Concern  . Not on file  Social History  Narrative   Bone Density Study: 09/06/13   Pap Smear: 12/2012   Mammogram: 09/2012    FAMILY HISTORY: Family History  Problem Relation Age of Onset  . Diabetes Mother        DM Type II   . Hyperlipidemia Mother   . Hypertension Mother   . Diabetes Father        DM Type II   . Hyperlipidemia Father   . Heart disease Father   . Hypertension Father     ALLERGIES:  has No Known Allergies.  MEDICATIONS:  Current Outpatient Medications  Medication Sig Dispense Refill  . levothyroxine (SYNTHROID, LEVOTHROID) 100 MCG tablet Take 1 tablet (100 mcg total) by mouth daily. 90 tablet 3  . OVER THE COUNTER MEDICATION Take 10 mLs by mouth every morning. Braggs Vinegar    . OVER THE COUNTER MEDICATION Take 1 tablet by mouth 2 (two) times daily. PM Phytogen Complex    . Tetrahydrozoline HCl (VISINE OP) Apply 1-2 drops to eye daily as  needed (red dry eyes.).    Marland Kitchen acetaminophen (TYLENOL) 500 MG tablet Take 1,000 mg by mouth as needed.     . Coenzyme Q10-Red Yeast Rice 60-600 MG CAPS Take 1 tablet by mouth 2 (two) times daily.     No current facility-administered medications for this visit.     REVIEW OF SYSTEMS:   Constitutional: ( - ) fevers, ( - )  chills , ( - ) night sweats Eyes: ( - ) blurriness of vision, ( - ) double vision, ( - ) watery eyes Ears, nose, mouth, throat, and face: ( - ) mucositis, ( - ) sore throat Respiratory: ( - ) cough, ( - ) dyspnea, ( - ) wheezes Cardiovascular: ( - ) palpitation, ( - ) chest discomfort, ( - ) lower extremity swelling Gastrointestinal:  ( - ) nausea, ( - ) heartburn, ( - ) change in bowel habits Skin: ( - ) abnormal skin rashes Lymphatics: ( - ) new lymphadenopathy, ( - ) easy bruising Neurological: ( - ) numbness, ( - ) tingling, ( - ) new weaknesses Behavioral/Psych: ( - ) mood change, ( - ) new changes  All other systems were reviewed with the patient and are negative.  PHYSICAL EXAMINATION: ECOG PERFORMANCE STATUS: 0 - Asymptomatic  Vitals:   04/26/19 0940  BP: 133/79  Pulse: 71  Resp: 19  Temp: 98 F (36.7 C)  SpO2: 100%   Filed Weights   04/26/19 0940  Weight: 194 lb 6.4 oz (88.2 kg)    GENERAL: alert, no distress and comfortable SKIN: skin color, texture, turgor are normal, no rashes or significant lesions EYES: conjunctiva are pink and non-injected, sclera clear OROPHARYNX: no exudate, no erythema; lips, buccal mucosa, and tongue normal  NECK: supple, non-tender LUNGS: clear to auscultation with normal breathing effort HEART: regular rate & rhythm, no murmurs, no lower extremity edema ABDOMEN: soft, non-tender, non-distended, normal bowel sounds Musculoskeletal: no cyanosis of digits and no clubbing  PSYCH: alert & oriented x 3, fluent speech NEURO: no focal motor/sensory deficits  LABORATORY DATA:  I have reviewed the data as listed Lab Results   Component Value Date   WBC 3.3 (L) 04/26/2019   HGB 13.8 04/26/2019   HCT 43.0 04/26/2019   MCV 86.2 04/26/2019   PLT 219 04/26/2019   Lab Results  Component Value Date   NA 142 04/26/2019   K 4.0 04/26/2019   CL 105 04/26/2019   CO2 29 04/26/2019  RADIOGRAPHIC STUDIES: I have personally reviewed the radiological images as listed and agreed with the findings in the report.  PATHOLOGY: I have reviewed the pathology reports as documented in the oncologist history.

## 2019-04-24 ENCOUNTER — Ambulatory Visit: Payer: 59 | Admitting: Hematology

## 2019-04-24 ENCOUNTER — Other Ambulatory Visit: Payer: 59

## 2019-04-26 ENCOUNTER — Inpatient Hospital Stay: Payer: 59 | Attending: Hematology

## 2019-04-26 ENCOUNTER — Encounter: Payer: Self-pay | Admitting: Hematology

## 2019-04-26 ENCOUNTER — Inpatient Hospital Stay (HOSPITAL_BASED_OUTPATIENT_CLINIC_OR_DEPARTMENT_OTHER): Payer: 59 | Admitting: Hematology

## 2019-04-26 ENCOUNTER — Other Ambulatory Visit: Payer: Self-pay

## 2019-04-26 VITALS — BP 133/79 | HR 71 | Temp 98.0°F | Resp 19 | Ht 63.0 in | Wt 194.4 lb

## 2019-04-26 DIAGNOSIS — D72819 Decreased white blood cell count, unspecified: Secondary | ICD-10-CM

## 2019-04-26 DIAGNOSIS — Z85850 Personal history of malignant neoplasm of thyroid: Secondary | ICD-10-CM | POA: Diagnosis not present

## 2019-04-26 LAB — CBC WITH DIFFERENTIAL (CANCER CENTER ONLY)
Abs Immature Granulocytes: 0.01 10*3/uL (ref 0.00–0.07)
Basophils Absolute: 0 10*3/uL (ref 0.0–0.1)
Basophils Relative: 1 %
Eosinophils Absolute: 0.1 10*3/uL (ref 0.0–0.5)
Eosinophils Relative: 2 %
HCT: 43 % (ref 36.0–46.0)
Hemoglobin: 13.8 g/dL (ref 12.0–15.0)
Immature Granulocytes: 0 %
Lymphocytes Relative: 51 %
Lymphs Abs: 1.7 10*3/uL (ref 0.7–4.0)
MCH: 27.7 pg (ref 26.0–34.0)
MCHC: 32.1 g/dL (ref 30.0–36.0)
MCV: 86.2 fL (ref 80.0–100.0)
Monocytes Absolute: 0.2 10*3/uL (ref 0.1–1.0)
Monocytes Relative: 6 %
Neutro Abs: 1.3 10*3/uL — ABNORMAL LOW (ref 1.7–7.7)
Neutrophils Relative %: 40 %
Platelet Count: 219 10*3/uL (ref 150–400)
RBC: 4.99 MIL/uL (ref 3.87–5.11)
RDW: 13.5 % (ref 11.5–15.5)
WBC Count: 3.3 10*3/uL — ABNORMAL LOW (ref 4.0–10.5)
nRBC: 0 % (ref 0.0–0.2)

## 2019-04-26 LAB — CMP (CANCER CENTER ONLY)
ALT: 17 U/L (ref 0–44)
AST: 17 U/L (ref 15–41)
Albumin: 4.7 g/dL (ref 3.5–5.0)
Alkaline Phosphatase: 90 U/L (ref 38–126)
Anion gap: 8 (ref 5–15)
BUN: 17 mg/dL (ref 6–20)
CO2: 29 mmol/L (ref 22–32)
Calcium: 9.9 mg/dL (ref 8.9–10.3)
Chloride: 105 mmol/L (ref 98–111)
Creatinine: 1.17 mg/dL — ABNORMAL HIGH (ref 0.44–1.00)
GFR, Est AFR Am: >60 mL/min (ref >60)
GFR, Estimated: 52 mL/min — ABNORMAL LOW (ref >60)
Glucose, Bld: 98 mg/dL (ref 70–99)
Potassium: 4 mmol/L (ref 3.5–5.1)
Sodium: 142 mmol/L (ref 135–145)
Total Bilirubin: 0.4 mg/dL (ref 0.3–1.2)
Total Protein: 7.8 g/dL (ref 6.5–8.1)

## 2019-04-26 LAB — FOLATE: Folate: 14.4 ng/mL (ref >5.9)

## 2019-04-26 LAB — LACTATE DEHYDROGENASE: LDH: 181 U/L (ref 98–192)

## 2019-04-26 LAB — SAVE SMEAR(SSMR), FOR PROVIDER SLIDE REVIEW

## 2019-04-26 LAB — VITAMIN B12: Vitamin B-12: 339 pg/mL (ref 180–914)

## 2019-04-26 NOTE — Patient Instructions (Signed)
Leukopenia  Leukopenia is a condition in which you have a low number of white blood cells. White blood cells help the body to fight infections. The number of white blood cells in the body varies from person to person.  There are five types of white blood cells. Two types (lymphocytes and neutrophils) make up most of the white blood cell count. When lymphocytes are low, the condition is called lymphocytopenia. When neutrophils are low, it is called neutropenia. Neutropenia is the most dangerous type of leukopenia because it can lead to dangerous infections.  What are the causes?  This condition is commonly caused by damage to soft tissue inside of the bones (bone marrow), which is where most white blood cells are made. Bone marrow can get damaged by:  · Medicine or X-ray treatments for cancer (chemotherapy or radiation therapy).  · Serious infections.  · Cancer of the white blood cells (leukemia, lymphoma, or myeloma).  · Medicines, including:  ? Certain antibiotics.  ? Certain heart medicines.  ? Steroids.  ? Certain medicines used to treat diseases of the immune system (autoimmune diseases), like rheumatoid arthritis.  Leukopenia also happens when white blood cells are destroyed after leaving the bone marrow, which may result from:  · Liver disease.  · Autoimmune disease.  · Vitamin B deficiencies.  What are the signs or symptoms?  One of the most common signs of leukopenia, especially severe neutropenia, is having a lot of bacterial infections. Different infections have different symptoms. An infection in your lungs may cause coughing. A urinary tract infection may cause frequent urination and a burning sensation. You may also get infections of the blood, skin, rectum, throat, sinuses, or ears.  Some people have no symptoms. If you do have symptoms, they may include:  · Fever.  · Fatigue.  · Swollen glands (lymph nodes).  · Painful mouth ulcers.  · Gum disease.  How is this diagnosed?  This condition may be  diagnosed based on:  · Your medical history.  · A physical exam to check for swollen lymph nodes and an enlarged spleen. Your spleen is an organ on the left side of your body that stores white blood cells.  · Tests, such as:  ? A complete blood count. This blood test counts each type of white cell.  ? Bone marrow aspiration. Some bone marrow is removed to be checked under a microscope.  ? Lymph node biopsy. Some lymph node tissue is removed to be checked under a microscope.  ? Other types of blood tests or imaging tests.  How is this treated?  Treatment of leukopenia depends on the cause. Some common treatments include:  · Antibiotic medicine to treat bacterial infections.  · Stopping medicines that may cause leukopenia.  · Medicines to stimulate neutrophil production (hematopoietic growth factors), to treat neutropenia.  Follow these instructions at home:  · Take over-the-counter and prescription medicines only as told by your health care provider. This includes supplements and vitamins.  · If you were prescribed an antibiotic medicine, take it as told by your health care provider. Do not stop taking the antibiotic even if you start to feel better.  · Preventing infection is important if you have leukopenia. To prevent infection:  ? Avoid close contact with sick people.  ? Wash your hands frequently with soap and water. If soap and water are not available, use hand sanitizer.  ? Do not eat uncooked or undercooked meats.  ? Wash fruits and vegetables before eating   Contact a health care provider if:  You have chills or a fever.  You have symptoms of an infection. Get help right away if:  You have a fever that lasts for more than 2-3 days.  You  have symptoms that last for more than 2-3 days.  You have trouble breathing.  You have chest pain. This information is not intended to replace advice given to you by your health care provider. Make sure you discuss any questions you have with your health care provider. Document Released: 07/24/2013 Document Revised: 07/01/2017 Document Reviewed: 06/08/2016 Elsevier Patient Education  2020 [employer].

## 2019-04-27 LAB — SURGICAL PATHOLOGY

## 2019-04-27 LAB — HEPATITIS B CORE ANTIBODY, TOTAL: Hep B Core Total Ab: NEGATIVE

## 2019-04-27 LAB — HEPATITIS B SURFACE ANTIGEN: Hepatitis B Surface Ag: NEGATIVE

## 2019-04-27 LAB — HEPATITIS C ANTIBODY (REFLEX): HCV Ab: <0.1 s/co ratio (ref 0.0–0.9)

## 2019-04-27 LAB — HEPATITIS B SURFACE ANTIBODY,QUALITATIVE: Hep B S Ab: NONREACTIVE

## 2019-04-27 LAB — HIV ANTIBODY (ROUTINE TESTING W REFLEX): HIV Screen 4th Generation wRfx: NONREACTIVE

## 2019-04-27 LAB — HCV COMMENT:

## 2019-04-28 LAB — COPPER, SERUM: Copper: 108 ug/dL (ref 72–166)

## 2019-04-30 DIAGNOSIS — N62 Hypertrophy of breast: Secondary | ICD-10-CM | POA: Diagnosis not present

## 2019-05-02 LAB — FLOW CYTOMETRY

## 2019-05-16 DIAGNOSIS — N62 Hypertrophy of breast: Secondary | ICD-10-CM | POA: Diagnosis not present

## 2019-06-12 ENCOUNTER — Other Ambulatory Visit: Payer: Self-pay

## 2019-06-12 ENCOUNTER — Ambulatory Visit: Payer: 59 | Attending: Orthopedic Surgery | Admitting: Physical Therapy

## 2019-06-12 ENCOUNTER — Encounter: Payer: Self-pay | Admitting: Physical Therapy

## 2019-06-12 DIAGNOSIS — M545 Low back pain: Secondary | ICD-10-CM | POA: Diagnosis not present

## 2019-06-12 DIAGNOSIS — R29898 Other symptoms and signs involving the musculoskeletal system: Secondary | ICD-10-CM | POA: Diagnosis not present

## 2019-06-12 DIAGNOSIS — M25511 Pain in right shoulder: Secondary | ICD-10-CM | POA: Diagnosis not present

## 2019-06-12 DIAGNOSIS — M25512 Pain in left shoulder: Secondary | ICD-10-CM | POA: Diagnosis not present

## 2019-06-12 DIAGNOSIS — M546 Pain in thoracic spine: Secondary | ICD-10-CM

## 2019-06-12 DIAGNOSIS — G8929 Other chronic pain: Secondary | ICD-10-CM | POA: Insufficient documentation

## 2019-06-12 NOTE — Therapy (Addendum)
[facility] [address]  [place], [place], [address] Phone: [phone]   Fax:  [phone]  Physical Therapy Evaluation  Patient Details  Name: Melinda Vazquez MRN: [id number] Date of Birth: February 29, 1966 Referring Provider (PT): Melina Schools, MD   Encounter Date: 06/12/2019  PT End of Session - 06/12/19 1023    Visit Number  1    Number of Visits  13    Date for PT Re-Evaluation  07/24/19    Authorization Type  [employer]    PT Start Time  0935    PT Stop Time  1020    PT Time Calculation (min)  45 min    Activity Tolerance  Patient tolerated treatment well;Patient limited by pain    Behavior During Therapy  [facility] for tasks assessed/performed       Past Medical History:  Diagnosis Date  . Acute back pain   . Acute hip pain   . Anemia    hx of  . Cancer ([place]) 10/2014   papillary thyroid carcinoma-radiation after surgery  . Colon polyp   . Degenerative disc disease, lumbar    L5-S1  . Fatigue   . Hyperlipidemia   . Osteoporosis   . Thyroid mass   . Thyroid nodule   . Vitamin D deficiency     Past Surgical History:  Procedure Laterality Date  . BIOPSY THYROID Bilateral 10/2014  . COLONOSCOPY  01/24/14  . ECTOPIC PREGNANCY SURGERY  1998  . ENDOMETRIAL ABLATION  2010  . LYMPH NODE DISSECTION N/A 11/29/2014   Procedure: LIMITED LYMPH NODE DISSECTION;  Surgeon: Armandina Gemma, MD;  Location: [facility];  Service: General;  Laterality: N/A;  . SALPINGOOPHORECTOMY     one side  . THYROIDECTOMY N/A 11/29/2014   Procedure: TOTAL THYROIDECTOMY LIMITED LYMPH NODE DISSECTION;  Surgeon: Armandina Gemma, MD;  Location: [facility];  Service: General;  Laterality: N/A;  . TUBAL LIGATION  2011    There were no vitals filed for this visit.   Subjective Assessment - 06/12/19 0936    Subjective  Patient reports B shoulder and back pain for the past year. Says that she has a slipped disc and deterioration in her back. Feels that her B upper  shoulder pain and upper back pain is d/t the weight of her breasts. Pain is located along midline of low and midback as well as over B UT. Back pain worse with prolonged sitting, twisting her waist. Better with muscle relaxers. Shoulder pain is worse with laying in sidelying, typing on the computer, and reaching overhead. Better with rest while lying in supine. Denies N/T.    Pertinent History  osteoporosis, HLD, fatigue, lumbar DDD L5-S1, papillary thyroid carcinoma 2016- radiation & thyroidectomy, acute hip & LBP    Limitations  Sitting;Lifting;House hold activities    How long can you sit comfortably?  variable    How long can you stand comfortably?  variable    How long can you walk comfortably?  variable    Diagnostic tests  none recent    Patient Stated Goals  "build up my core"    Currently in Pain?  Yes    Pain Score  8     Pain Location  Back    Pain Orientation  Lower;Mid;Upper   midline   Pain Descriptors / Indicators  Aching;Tender    Pain Type  Chronic pain    Multiple Pain Sites  Yes    Pain Score  6  Pain Location  Shoulder    Pain Orientation  Right;Left;Upper    Pain Descriptors / Indicators  Discomfort;Pressure    Pain Type  Chronic pain         [facility] PT Assessment - 06/12/19 0946      Assessment   Medical Diagnosis  Back pain    Referring Provider (PT)  Melina Schools, MD    Onset Date/Surgical Date  06/11/18    Hand Dominance  Right    Next MD Visit  --    Prior Therapy  no      Precautions   Precautions  --   osteoporosis; hx thyroid CA     Balance Screen   Has the patient fallen in the past 6 months  No    Has the patient had a decrease in activity level because of a fear of falling?   No    Is the patient reluctant to leave their home because of a fear of falling?   No      Home Environment   Living Environment  Private residence    Living Arrangements  Alone    Type of [place] to enter    Entrance  Stairs-Number of Steps  1    Entrance Stairs-Rails  None    Home Layout  Two level    Alternate Level Stairs-Number of Steps  12    Alternate Level Stairs-Rails  Left      Prior Function   Level of Independence  Independent    Vocation  Full time employment    Education officer, museum- desk work    Leisure  none      Cognition   Overall Cognitive Status  Within Functional Limits for tasks assessed      Sensation   Light Touch  Not tested      Coordination   Gross Motor Movements are Fluid and Coordinated  Yes      ROM / Strength   AROM / PROM / Strength  AROM;Strength      AROM   AROM Assessment Site  Lumbar;Shoulder    Right/Left Shoulder  --   B shoulder AROM WNL and symmetrical but painful    Lumbar Flexion  ankles   severe pain in LB   Lumbar Extension  mildly limited   severe pain in LB   Lumbar - Right Side Bend  jt line   severe pain in LB   Lumbar - Left Side Bend  jt line   severe pain in LB   Lumbar - Right Rotation  mildly limited   severe pain in LB   Lumbar - Left Rotation  mildly limited      Strength   Strength Assessment Site  Shoulder;Hip;Knee;Ankle    Right/Left Shoulder  Right;Left    Right Shoulder Flexion  4+/5    Right Shoulder ABduction  4+/5    Right Shoulder Internal Rotation  4/5    Right Shoulder External Rotation  4/5    Left Shoulder Flexion  4+/5    Left Shoulder ABduction  4+/5    Left Shoulder Internal Rotation  4+/5    Left Shoulder External Rotation  4+/5    Right/Left Hip  Right;Left   pain in back   Right Hip Flexion  5/5    Right Hip ABduction  4+/5    Right Hip ADduction  4+/5    Left Hip Flexion  5/5    Left Hip ABduction  4+/5    Left Hip ADduction  4+/5    Right/Left Knee  Right;Left   pain in back   Right Knee Flexion  4+/5    Right Knee Extension  4+/5    Left Knee Flexion  4+/5    Left Knee Extension  4+/5    Right/Left Ankle  Right;Left    Right Ankle Dorsiflexion  4+/5    Right Ankle  Plantar Flexion  4+/5    Left Ankle Dorsiflexion  4+/5    Left Ankle Plantar Flexion  4+/5      Palpation   Palpation comment  TTP in B LS, UT, deltoid, pec, scalenes, infrapsinatus; increased soft tissue restriction in scalenes and UT; TTP along spinous process of thoracolumbar spine and along B paraspinals with increased soft tissue restriction R>L      Ambulation/Gait   Assistive device  None    Gait Pattern  Within Functional Limits                Objective measurements completed on examination: See above findings.              PT Education - 06/12/19 1023    Education Details  prognosis, POC, HEP    Person(s) Educated  Patient    Methods  Explanation;Demonstration;Tactile cues;Verbal cues;Handout    Comprehension  Verbalized understanding;Returned demonstration       PT Short Term Goals - 06/12/19 1030      PT SHORT TERM GOAL #1   Title  Patient to be independent with initial HEP.    Time  3    Period  Weeks    Status  New    Target Date  07/03/19        PT Long Term Goals - 06/12/19 1030      PT LONG TERM GOAL #1   Title  Patient to be independent with advanced HEP.    Time  6    Period  Weeks    Status  New    Target Date  07/24/19      PT LONG TERM GOAL #2   Title  Patient to demonstrate lumbar AROM WFL and with mild pain remaining.    Time  6    Period  Weeks    Status  New    Target Date  07/24/19      PT LONG TERM GOAL #3   Title  Patient to report and demonstrate improvement in postural correction.    Time  6    Period  Weeks    Status  New    Target Date  07/24/19      PT LONG TERM GOAL #4   Title  Patient to report 80% improvement in pain levels while working at computer.    Time  6    Period  Weeks    Status  New    Target Date  07/24/19             Plan - 06/12/19 1023    Clinical Impression Statement  Patient is a 53y/o F presenting to OPPT with c/o B UT and midline mid-low back pain of 1 year duration.  Back pain worse with prolonged sitting, twisting her waist. Shoulder pain is worse with laying in sidelying, typing on the computer, and reaching overhead. Patient requires sitting at a desk throughout her entire work day and reports that she feels that her midback and shoulder pain  is d/t her chest size. Patient today presenting with good overall UE/LE strength, limited and painful lumbar AROM, WNL B shoulder AROM but painful, tenderness and soft tissue restriction along B posterior, lateral, and anterior shoulder musculature as well as along thoracolumbar spine and surrounding musculature. Educated patient on gentle stretching HEP- patient reported understanding. Would benefit form skilled PT services 2x/week for 6 weeks to address aforementioned impairments.    Personal Factors and Comorbidities  Age;Comorbidity 3+;Past/Current Experience;Profession;Time since onset of injury/illness/exacerbation    Comorbidities  osteoporosis, HLD, fatigue, lumbar DDD L5-S1, papillary thyroid carcinoma 2016- radiation & thyroidectomy, acute hip & LBP    Examination-Activity Limitations  Sit;Bed Mobility;Sleep;Bend;Squat;Carry;Dressing;Hygiene/Grooming;Lift;Reach Overhead    Examination-Participation Restrictions  Cleaning;Shop;Community Activity;Driving;Yard Work;Interpersonal Relationship;Laundry;Meal Prep    Stability/Clinical Decision Making  Stable/Uncomplicated    Clinical Decision Making  Low    Rehab Potential  Good    PT Frequency  2x / week    PT Duration  6 weeks    PT Treatment/Interventions  ADLs/Self Care Home Management;Cryotherapy;Electrical Stimulation;Moist Heat;Therapeutic exercise;Balance training;Therapeutic activities;Functional mobility training;Stair training;Gait training;Ultrasound;Neuromuscular re-education;Patient/family education;Manual techniques;Taping;Energy conservation;Dry needling;Passive range of motion    PT Next Visit Plan  reassess HEP    Consulted and Agree with Plan of Care   Patient       Patient will benefit from skilled therapeutic intervention in order to improve the following deficits and impairments:  Decreased activity tolerance, Impaired UE functional use, Pain, Increased muscle spasms, Improper body mechanics, Decreased range of motion, Impaired flexibility, Postural dysfunction  Visit Diagnosis: Pain in thoracic spine  Chronic left shoulder pain  Chronic right shoulder pain  Chronic midline low back pain without sciatica  Other symptoms and signs involving the musculoskeletal system     Problem List Patient Active Problem List   Diagnosis Date Noted  . Leukopenia 04/16/2019  . Postsurgical hypothyroidism 12/04/2014  . Papillary thyroid carcinoma ([place]) 11/29/2014      Janene Harvey, PT, DPT 06/12/19 10:36 AM   [facility] [address]  [place], [place], [address] Phone: [phone]   Fax:  [phone]  Name: Melinda Vazquez MRN: [id number] Date of Birth: Dec 26, 1965   PHYSICAL THERAPY DISCHARGE SUMMARY  Visits from Start of Care: 1  Current functional level related to goals / functional outcomes: See above clinical impression; patient did not return since initial eval    Remaining deficits: See above   Education / Equipment: HEP  Plan: Patient agrees to discharge.  Patient goals were not met. Patient is being discharged due to not returning since the last visit.  ?????     Janene Harvey, PT, DPT 07/05/19 11:20 AM

## 2019-08-08 DIAGNOSIS — U071 COVID-19: Secondary | ICD-10-CM | POA: Diagnosis not present

## 2019-09-14 DIAGNOSIS — Z0100 Encounter for examination of eyes and vision without abnormal findings: Secondary | ICD-10-CM | POA: Diagnosis not present

## 2019-09-23 ENCOUNTER — Other Ambulatory Visit: Payer: Self-pay | Admitting: Internal Medicine

## 2019-09-24 ENCOUNTER — Other Ambulatory Visit: Payer: Self-pay

## 2019-09-24 MED ORDER — LEVOTHYROXINE SODIUM 100 MCG PO TABS: 100.0000 ug | ORAL_TABLET | Freq: Every day | ORAL | 0 refills | Status: DC

## 2019-11-02 ENCOUNTER — Ambulatory Visit: Payer: 59 | Admitting: Internal Medicine

## 2019-11-02 DIAGNOSIS — Z01419 Encounter for gynecological examination (general) (routine) without abnormal findings: Secondary | ICD-10-CM | POA: Diagnosis not present

## 2019-11-02 DIAGNOSIS — Z9889 Other specified postprocedural states: Secondary | ICD-10-CM | POA: Diagnosis not present

## 2019-11-02 DIAGNOSIS — B373 Candidiasis of vulva and vagina: Secondary | ICD-10-CM | POA: Diagnosis not present

## 2019-11-02 DIAGNOSIS — I10 Essential (primary) hypertension: Secondary | ICD-10-CM | POA: Diagnosis not present

## 2019-11-08 ENCOUNTER — Other Ambulatory Visit: Payer: Self-pay

## 2019-11-12 ENCOUNTER — Other Ambulatory Visit: Payer: Self-pay

## 2019-11-12 ENCOUNTER — Ambulatory Visit (INDEPENDENT_AMBULATORY_CARE_PROVIDER_SITE_OTHER): Payer: 59 | Admitting: Internal Medicine

## 2019-11-12 ENCOUNTER — Encounter: Payer: Self-pay | Admitting: Internal Medicine

## 2019-11-12 VITALS — BP 118/70 | HR 67 | Ht 63.0 in | Wt 192.0 lb

## 2019-11-12 DIAGNOSIS — C73 Malignant neoplasm of thyroid gland: Secondary | ICD-10-CM

## 2019-11-12 DIAGNOSIS — E890 Postprocedural hypothyroidism: Secondary | ICD-10-CM | POA: Diagnosis not present

## 2019-11-12 NOTE — Patient Instructions (Signed)
Please stop at the lab.  Please continue Levothyroxine 100 mcg daily.  Take the thyroid hormone every day, with water, at least 30 minutes before breakfast, separated by at least 4 hours from: - acid reflux medications - calcium - iron - multivitamins  Please return in 1 year.

## 2019-11-12 NOTE — Progress Notes (Addendum)
Patient ID: Melinda Vazquez, female   DOB: [birth year], 54 y.o.   MRN: [id number]  This visit occurred during the SARS-CoV-2 public health emergency.  Safety protocols were in place, including screening questions prior to the visit, additional usage of staff PPE, and extensive cleaning of exam room while observing appropriate contact time as indicated for disinfecting solutions.   HPI  Melinda Vazquez is a 54 y.o.-year-old female, returning for management of follicular variant of papillary thyroid cancer and postsurgical hypothyroidism. Last visit 2.5 years ago! Insurance: [facility].  Reviewed history: - Pt was seen by PCP in 06/2014 and c/o feeling a neck mass >> she was sent for a thyroid U/S. - Thyroid U/S ([facility], 06/26/2014): 3.7 x 4.1 x 1.9 cm, complex, vascular. - FNA (10/15/14):  Adequacy Reason Satisfactory For Evaluation. Diagnosis THYROID, FINE NEEDLE ASPIRATION, MIDLINE INFERIOR ISTHMUS (SPECIMEN 1 OF 1, COLLECTED ON 10/15/14): MALIGNANT (BETHESDA CATEGORY VI). FINDINGS CONSISTENT WITH PAPILLARY THYROID CARCINOMA. [name] MD Pathologist, Electronic Signature (Case signed 10/16/2014) Specimen Clinical Information 3.7 x 4.1 x 1.9 cm heterogeneous hypervascular nodule inferior thyroid isthmus Source Thyroid, Fine Needle Aspiration, Midline Inferior Isthmus - Thyroidectomy (11/29/2014) - Dr Harlow Asa:   1. Thyroid, thyroidectomy, total - FOLLICULAR VARIANT OF PAPILLARY THYROID CARCINOMA, 3.7 CM, LIMITED TO THYROIDAL PARENCHYMA. - NO EVIDENCE OF ANGIOLYMPHATIC INVASION IDENTIFIED - RESECTION MARGINS, NEGATIVE FOR ATYPIA OR MALIGNANCY. 2. Lymph nodes, regional resection, central compartment lymph node VI - SEVEN LYMPH NODES, NEGATIVE FOR METASTATIC CARCINOMA (0/7) Microscopic Comment 1. THYROID Specimen: Thyroid gland Procedure: Total thyroidectomy Specimen Integrity (intact/fragmented): Intact Tumor focality: Unifocal Maximum tumor size (cm): 3.7 cm, gross measurement Tumor  laterality: Right lobe, mid to inferior Histologic type (including subtype and/or unique features as applicable): Follicular variant of papillary thyroid carcinoma Tumor capsule: Not present Extrathyroidal extension: No Margins: Negative Lymph - Vascular invasion: Not identified. Capsular invasion with degree of invasion if present: N/A Second and additional tumors: N/A Lymph nodes: # examined 7; # positive; 0 TNM code: pT2 , pN0 Non-neoplastic thyroid: Unremarkable. - RAI Tx (03/17/2015): 69.2 mCi  (done with Thyrogen) - post RAI tx WBS (03/24/2015): negative, except remnant activity in thyroid bed - 07/31/2016: Neck ultrasound: Post total thyroidectomy without evidence of nodular tissue within the resection bed to suggest locally recurrent or metastatic disease.  Lab Results  Component Value Date   THYROGLB <0.1 (L) 07/04/2017   THYROGLB 0.1 (L) 06/11/2016   THYROGLB 0.1 (L) 12/11/2015   THYROGLB <0.1 (L) 06/09/2015   THGAB <1 07/04/2017   THGAB <1 06/11/2016   THGAB <1 12/11/2015   THGAB <1 06/09/2015   Pt is on levothyroxine (now generic) 100 mcg daily, taken: - in am - fasting - 1-2 hours from b'fast - no Ca, Fe, PPIs - stoped Multivitamins - was taking them at noon - at last visit she was on biotin (1700 mcg) >> now off Biotin  Latest TSH: 09/04/2018: TSH 28.027 (she was off LT4 after she ran out) Lab Results  Component Value Date   TSH 0.49 07/04/2017   TSH 1.45 06/11/2016   TSH 1.44 12/11/2015   TSH 0.40 07/14/2015   TSH 0.20 (L) 02/06/2015   FREET4 0.85 07/04/2017   FREET4 1.1 06/11/2016   FREET4 0.88 12/11/2015   FREET4 0.78 07/14/2015   FREET4 0.94 02/06/2015  06/26/2014: TSH 0.45  Pt denies: - feeling nodules in neck - hoarseness - dysphagia - choking - SOB with lying down  She has GERD >> saw ENT >> throat irritation.  Pt does have a FH of thyroid ds. in daughter - thyroid cancer (? Type). No h/o radiation tx to head or neck.  She had a high  HbA1c,of 6.5%. She improved her diet but still drinks juice -advised her to stop.  ROS: Constitutional: no weight gain/no weight loss, no fatigue, no subjective hyperthermia, no subjective hypothermia Eyes: no blurry vision, no xerophthalmia ENT: no sore throat, + see HPI Cardiovascular: no CP/no SOB/no palpitations/no leg swelling Respiratory: no cough/no SOB/no wheezing Gastrointestinal: no N/no V/no D/no C/no acid reflux Musculoskeletal: no muscle aches/no joint aches Skin: no rashes, + hair loss Neurological: no tremors/no numbness/no tingling/no dizziness  I reviewed pt's medications, allergies, PMH, social hx, family hx, and changes were documented in the history of present illness. Otherwise, unchanged from my initial visit note.  Past Medical History:  Diagnosis Date  . Acute back pain   . Acute hip pain   . Anemia    hx of  . Cancer ([place]) 10/2014   papillary thyroid carcinoma-radiation after surgery  . Colon polyp   . Degenerative disc disease, lumbar    L5-S1  . Fatigue   . Hyperlipidemia   . Osteoporosis   . Thyroid mass   . Thyroid nodule   . Vitamin D deficiency    Past Surgical History:  Procedure Laterality Date  . BIOPSY THYROID Bilateral 10/2014  . COLONOSCOPY  01/24/14  . ECTOPIC PREGNANCY SURGERY  1998  . ENDOMETRIAL ABLATION  2010  . LYMPH NODE DISSECTION N/A 11/29/2014   Procedure: LIMITED LYMPH NODE DISSECTION;  Surgeon: Armandina Gemma, MD;  Location: [facility];  Service: General;  Laterality: N/A;  . SALPINGOOPHORECTOMY     one side  . THYROIDECTOMY N/A 11/29/2014   Procedure: TOTAL THYROIDECTOMY LIMITED LYMPH NODE DISSECTION;  Surgeon: Armandina Gemma, MD;  Location: [facility];  Service: General;  Laterality: N/A;  . TUBAL LIGATION  2011   History   Social History  . Marital Status: Single    Spouse Name: N/A  . Number of Children: 2   Occupational History  . Small business customer svs rep   Social History Main Topics  . Smoking status: Current Some  Day Smoker  . Smokeless tobacco: Not on file  . Alcohol Use: Occasional, red wine  . Drug Use: No  . Sexual Activity: Yes    Birth Control/ Protection: Surgical   Social History Narrative   Bone Density Study: 09/06/13   Pap Smear: 12/2012   Mammogram: 09/2012      Current Outpatient Medications on File Prior to Visit  Medication Sig Dispense Refill  . acetaminophen (TYLENOL) 500 MG tablet Take 1,000 mg by mouth as needed.     . Coenzyme Q10-Red Yeast Rice 60-600 MG CAPS Take 1 tablet by mouth 2 (two) times daily.    Marland Kitchen levothyroxine (SYNTHROID) 100 MCG tablet Take 1 tablet (100 mcg total) by mouth daily. 90 tablet 0  . OVER THE COUNTER MEDICATION Take 10 mLs by mouth every morning. Braggs Vinegar    . OVER THE COUNTER MEDICATION Take 1 tablet by mouth 2 (two) times daily. PM Phytogen Complex    . Tetrahydrozoline HCl (VISINE OP) Apply 1-2 drops to eye daily as needed (red dry eyes.).     No current facility-administered medications on file prior to visit.   No Known Allergies Family History  Problem Relation Age of Onset  . Diabetes Mother        DM Type II   . Hyperlipidemia Mother   .  Hypertension Mother   . Diabetes Father        DM Type II   . Hyperlipidemia Father   . Heart disease Father   . Hypertension Father    PE: BP 118/70   Pulse 67   Ht '5\' 3"'  (1.6 m)   Wt 192 lb (87.1 kg)   SpO2 98%   BMI 34.01 kg/m  Body mass index is 34.01 kg/m. Wt Readings from Last 3 Encounters:  11/12/19 192 lb (87.1 kg)  04/26/19 194 lb 6.4 oz (88.2 kg)  09/22/18 204 lb 6 oz (92.7 kg)   Constitutional: overweight, in NAD Eyes: PERRLA, EOMI, no exophthalmos ENT: moist mucous membranes, no masses palpable in neck, no cervical lymphadenopathy Cardiovascular: RRR, No MRG Respiratory: CTA B Gastrointestinal: abdomen soft, NT, ND, BS+ Musculoskeletal: no deformities, strength intact in all 4 Skin: moist, warm, no rashes Neurological: no tremor with outstretched hands, DTR normal in  all 4  ASSESSMENT: 1. Follicular variant of Papillary thyroid cancer  2. Postsurgical hypothyroidism   PLAN: 1. FV of PTC - Patient has low risk papillary thyroid cancer, the follicular variant.  This was also unifocal, it did not spread to the lymph vessels (0 out of 7 lymph nodes analyzed), she had RAI treatment in 03/2015, and posttreatment whole-body scan showed no metastases. -Reviewed latest neck ultrasound that did not show any recurrences or metastases in the neck. -She denies any neck compression symptoms, only has a slight sensation of pulling with the right side of her scar.  I explained that this is normal and most likely due to scarring of the tissue behind the cervical incision. -We will repeat her TFTs today along with thyroglobulin and ATA.  Of note, her previous thyroglobulin levels have been undetectable. -At next visit, we may repeat another neck ultrasound, 5 years from the previous.  2. Postsurgical hypothyroidism - latest thyroid labs reviewed with pt >> normal in 2018, however, since then, she had another TSH that was very high, at 28.027 on 09/04/2018.  I was not aware of these results at that time, just saw this in her chart now... She tells me that at that time she was off levothyroxine for few days after running out.   - she continues on LT4 (now generic) 100 mcg daily  - pt does not feel as good as she felt on the brand name levothyroxine after switching to generic.  I explained that this may be due to an abnormal TSH.  If the TSH is normal today, she needs to let me know if she wants to switch back to brand name Synthroid, depending on her finances.  Otherwise, I would suggest to first optimize the dose and see how she feels before changing formulations. - we discussed about taking the thyroid hormone every day, with water, >30 minutes before breakfast, separated by >4 hours from acid reflux medications, calcium, iron, multivitamins. Pt. is taking it correctly. - will  check thyroid tests today: TSH and fT4 - If labs are abnormal, she will need to return for repeat TFTs in 1.5 months  Orders Placed This Encounter  Procedures  . TSH  . T4, free  . Thyroglobulin Level  . Thyroglobulin antibody   Component     Latest Ref Rng & Units 11/12/2019  Thyroglobulin     ng/mL 0.1 (L)  TSH     0.35 - 4.50 uIU/mL 0.30 (L)  T4,Free(Direct)     0.60 - 1.60 ng/dL 1.06  Thyroglobulin Ab     <  or = 1 IU/mL <1   Thyroglobulin detectable, very low.  Undetectable antithyroglobulin antibodies.  TSH slightly low, but due to the detectable thyroglobulin, I would suggest to continue the current dose of levothyroxine for now.  I would suggest to repeat the TSH in 6 months.  Philemon Kingdom, MD PhD [facility] Endocrinology

## 2019-11-13 LAB — TSH: TSH: 0.3 u[IU]/mL — ABNORMAL LOW (ref 0.35–4.50)

## 2019-11-13 LAB — THYROGLOBULIN LEVEL: Thyroglobulin: 0.1 ng/mL — ABNORMAL LOW

## 2019-11-13 LAB — T4, FREE: Free T4: 1.06 ng/dL (ref 0.60–1.60)

## 2019-11-13 LAB — THYROGLOBULIN ANTIBODY: Thyroglobulin Ab: <1 IU/mL (ref <=1)

## 2019-11-13 MED ORDER — LEVOTHYROXINE SODIUM 100 MCG PO TABS: 100.0000 ug | ORAL_TABLET | Freq: Every day | ORAL | 3 refills | Status: DC

## 2019-11-13 NOTE — Addendum Note (Signed)
Addended by: Philemon Kingdom on: 11/13/2019 05:11 PM   Modules accepted: Orders

## 2019-11-14 ENCOUNTER — Telehealth: Payer: Self-pay

## 2019-11-14 NOTE — Telephone Encounter (Signed)
Read result note to patient, she does not understand what it means.  Patient would like clarification on what these mean and if there is anything she should do other than take the medication.

## 2019-11-14 NOTE — Telephone Encounter (Signed)
Overall, labs look good.  We have been checking the thyroglobulin to monitor possible cancer recurrence.  Her thyroglobulin level is very low, which is good.  Let us continue the same dose of levothyroxine even though the TSH is slightly low, she is a slightly low TSH can help keep the thyroglobulin suppressed.

## 2019-11-14 NOTE — Telephone Encounter (Signed)
-----   Message from Philemon Kingdom, MD sent at 11/13/2019  5:12 PM EDT ----- Also, please have her come back for repeat of her TFTs in 6 months.  Labs are in.

## 2019-11-15 NOTE — Telephone Encounter (Signed)
Notified patient of message from Dr. Gherghe, patient expressed understanding and agreement. No further questions.  

## 2020-01-07 DIAGNOSIS — Z1239 Encounter for other screening for malignant neoplasm of breast: Secondary | ICD-10-CM | POA: Diagnosis not present

## 2020-01-07 DIAGNOSIS — Z1231 Encounter for screening mammogram for malignant neoplasm of breast: Secondary | ICD-10-CM | POA: Diagnosis not present

## 2020-04-25 ENCOUNTER — Ambulatory Visit: Payer: 59 | Admitting: Hematology

## 2020-04-25 ENCOUNTER — Other Ambulatory Visit: Payer: 59

## 2020-04-25 ENCOUNTER — Ambulatory Visit: Payer: 59 | Admitting: Family

## 2020-05-01 ENCOUNTER — Other Ambulatory Visit: Payer: 59

## 2020-05-01 ENCOUNTER — Ambulatory Visit: Payer: 59 | Admitting: Family

## 2020-05-06 ENCOUNTER — Inpatient Hospital Stay: Payer: No Typology Code available for payment source | Admitting: Family

## 2020-05-06 ENCOUNTER — Inpatient Hospital Stay: Payer: No Typology Code available for payment source

## 2020-06-20 DIAGNOSIS — Z0100 Encounter for examination of eyes and vision without abnormal findings: Secondary | ICD-10-CM | POA: Diagnosis not present

## 2020-08-07 DIAGNOSIS — Z20822 Contact with and (suspected) exposure to covid-19: Secondary | ICD-10-CM | POA: Diagnosis not present

## 2020-10-07 DIAGNOSIS — Z13228 Encounter for screening for other metabolic disorders: Secondary | ICD-10-CM | POA: Diagnosis not present

## 2020-10-07 DIAGNOSIS — Z20822 Contact with and (suspected) exposure to covid-19: Secondary | ICD-10-CM | POA: Diagnosis not present

## 2020-10-07 DIAGNOSIS — Z780 Asymptomatic menopausal state: Secondary | ICD-10-CM | POA: Diagnosis not present

## 2020-10-07 DIAGNOSIS — E559 Vitamin D deficiency, unspecified: Secondary | ICD-10-CM | POA: Diagnosis not present

## 2020-10-07 DIAGNOSIS — Z114 Encounter for screening for human immunodeficiency virus [HIV]: Secondary | ICD-10-CM | POA: Diagnosis not present

## 2020-10-07 DIAGNOSIS — Z13220 Encounter for screening for lipoid disorders: Secondary | ICD-10-CM | POA: Diagnosis not present

## 2020-10-07 DIAGNOSIS — Z131 Encounter for screening for diabetes mellitus: Secondary | ICD-10-CM | POA: Diagnosis not present

## 2020-10-07 DIAGNOSIS — Z136 Encounter for screening for cardiovascular disorders: Secondary | ICD-10-CM | POA: Diagnosis not present

## 2020-10-07 DIAGNOSIS — Z0000 Encounter for general adult medical examination without abnormal findings: Secondary | ICD-10-CM | POA: Diagnosis not present

## 2020-10-07 DIAGNOSIS — E039 Hypothyroidism, unspecified: Secondary | ICD-10-CM | POA: Diagnosis not present

## 2020-10-07 DIAGNOSIS — R0602 Shortness of breath: Secondary | ICD-10-CM | POA: Diagnosis not present

## 2020-10-07 DIAGNOSIS — R69 Illness, unspecified: Secondary | ICD-10-CM | POA: Diagnosis not present

## 2020-10-28 DIAGNOSIS — I10 Essential (primary) hypertension: Secondary | ICD-10-CM | POA: Diagnosis not present

## 2020-10-28 DIAGNOSIS — E039 Hypothyroidism, unspecified: Secondary | ICD-10-CM | POA: Diagnosis not present

## 2020-10-28 DIAGNOSIS — E782 Mixed hyperlipidemia: Secondary | ICD-10-CM | POA: Diagnosis not present

## 2020-10-28 DIAGNOSIS — E559 Vitamin D deficiency, unspecified: Secondary | ICD-10-CM | POA: Diagnosis not present

## 2020-11-03 DIAGNOSIS — R69 Illness, unspecified: Secondary | ICD-10-CM | POA: Diagnosis not present

## 2020-11-03 DIAGNOSIS — Z113 Encounter for screening for infections with a predominantly sexual mode of transmission: Secondary | ICD-10-CM | POA: Diagnosis not present

## 2020-11-03 DIAGNOSIS — Z1151 Encounter for screening for human papillomavirus (HPV): Secondary | ICD-10-CM | POA: Diagnosis not present

## 2020-11-03 DIAGNOSIS — Z01419 Encounter for gynecological examination (general) (routine) without abnormal findings: Secondary | ICD-10-CM | POA: Diagnosis not present

## 2020-11-03 DIAGNOSIS — Z124 Encounter for screening for malignant neoplasm of cervix: Secondary | ICD-10-CM | POA: Diagnosis not present

## 2020-11-11 ENCOUNTER — Ambulatory Visit: Payer: 59 | Admitting: Internal Medicine

## 2020-11-11 NOTE — Progress Notes (Deleted)
Patient ID: Melinda Vazquez, female   DOB: August 28, 1965, 55 y.o.   MRN: [id number]  This visit occurred during the SARS-CoV-2 public health emergency.  Safety protocols were in place, including screening questions prior to the visit, additional usage of staff PPE, and extensive cleaning of exam room while observing appropriate contact time as indicated for disinfecting solutions.   HPI  Melinda Vazquez is a 55 y.o.-year-old female, returning for management of follicular variant of papillary thyroid cancer and postsurgical hypothyroidism.  Last visit 1 year ago. Insurance: [facility].  Interim history:   Reviewed history: - Pt was seen by PCP in 06/2014 and c/o feeling a neck mass >> she was sent for a thyroid U/S. - Thyroid U/S ([facility], 06/26/2014): 3.7 x 4.1 x 1.9 cm, complex, vascular. - FNA (10/15/14):  Adequacy Reason Satisfactory For Evaluation. Diagnosis THYROID, FINE NEEDLE ASPIRATION, MIDLINE INFERIOR ISTHMUS (SPECIMEN 1 OF 1, COLLECTED ON 10/15/14): MALIGNANT (BETHESDA CATEGORY VI). FINDINGS CONSISTENT WITH PAPILLARY THYROID CARCINOMA. [name] MD Pathologist, Electronic Signature (Case signed 10/16/2014) Specimen Clinical Information 3.7 x 4.1 x 1.9 cm heterogeneous hypervascular nodule inferior thyroid isthmus Source Thyroid, Fine Needle Aspiration, Midline Inferior Isthmus - Thyroidectomy (11/29/2014) - Dr Harlow Asa:   1. Thyroid, thyroidectomy, total - FOLLICULAR VARIANT OF PAPILLARY THYROID CARCINOMA, 3.7 CM, LIMITED TO THYROIDAL PARENCHYMA. - NO EVIDENCE OF ANGIOLYMPHATIC INVASION IDENTIFIED - RESECTION MARGINS, NEGATIVE FOR ATYPIA OR MALIGNANCY. 2. Lymph nodes, regional resection, central compartment lymph node VI - SEVEN LYMPH NODES, NEGATIVE FOR METASTATIC CARCINOMA (0/7) Microscopic Comment 1. THYROID Specimen: Thyroid gland Procedure: Total thyroidectomy Specimen Integrity (intact/fragmented): Intact Tumor focality: Unifocal Maximum tumor size (cm): 3.7 cm, gross  measurement Tumor laterality: Right lobe, mid to inferior Histologic type (including subtype and/or unique features as applicable): Follicular variant of papillary thyroid carcinoma Tumor capsule: Not present Extrathyroidal extension: No Margins: Negative Lymph - Vascular invasion: Not identified. Capsular invasion with degree of invasion if present: N/A Second and additional tumors: N/A Lymph nodes: # examined 7; # positive; 0 TNM code: pT2 , pN0 Non-neoplastic thyroid: Unremarkable. - RAI Tx (03/17/2015): 69.2 mCi  (done with Thyrogen) - post RAI tx WBS (03/24/2015): negative, except remnant activity in thyroid bed - 07/31/2016: Neck ultrasound: Post total thyroidectomy without evidence of nodular tissue within the resection bed to suggest locally recurrent or metastatic disease.  Lab Results  Component Value Date   THYROGLB 0.1 (L) 11/12/2019   THYROGLB <0.1 (L) 07/04/2017   THYROGLB 0.1 (L) 06/11/2016   THYROGLB 0.1 (L) 12/11/2015   THYROGLB <0.1 (L) 06/09/2015   THGAB <1 11/12/2019   THGAB <1 07/04/2017   THGAB <1 06/11/2016   THGAB <1 12/11/2015   THGAB <1 06/09/2015   Pt is on levothyroxine (now generic) 100 mcg daily, taken: - in am - fasting - 1-2 hours from b'fast - no Ca, Fe, PPIs - stoped Multivitamins - was taking them at noon - at last visit she was on biotin (1700 mcg) >> now off Biotin  Latest TFTs: Lab Results  Component Value Date   TSH 0.30 (L) 11/12/2019   TSH 0.49 07/04/2017   TSH 1.45 06/11/2016   TSH 1.44 12/11/2015   TSH 0.40 07/14/2015   TSH 0.20 (L) 02/06/2015   FREET4 1.06 11/12/2019   FREET4 0.85 07/04/2017   FREET4 1.1 06/11/2016   FREET4 0.88 12/11/2015   FREET4 0.78 07/14/2015   FREET4 0.94 02/06/2015  09/04/2018: TSH 28.027 (she was off LT4 after she ran out) 06/26/2014: TSH 0.45  Pt denies: -  feeling nodules in neck - hoarseness - dysphagia - choking - SOB with lying down  She has GERD >> saw ENT >> throat  irritation.  Pt does have a FH of thyroid ds. in daughter - thyroid cancer (? Type). No h/o radiation tx to head or neck.  She had a high HbA1c,of 6.5%. She improved her diet but still drinks juice -advised her to stop.  ROS: Constitutional: no weight gain/no weight loss, no fatigue, no subjective hyperthermia, no subjective hypothermia Eyes: no blurry vision, no xerophthalmia ENT: no sore throat, + see HPI Cardiovascular: no CP/no SOB/no palpitations/no leg swelling Respiratory: no cough/no SOB/no wheezing Gastrointestinal: no N/no V/no D/no C/no acid reflux Musculoskeletal: no muscle aches/no joint aches Skin: no rashes, + hair loss Neurological: no tremors/no numbness/no tingling/no dizziness  I reviewed pt's medications, allergies, PMH, social hx, family hx, and changes were documented in the history of present illness. Otherwise, unchanged from my initial visit note.  Past Medical History:  Diagnosis Date  . Acute back pain   . Acute hip pain   . Anemia    hx of  . Cancer ([place]) 10/2014   papillary thyroid carcinoma-radiation after surgery  . Colon polyp   . Degenerative disc disease, lumbar    L5-S1  . Fatigue   . Hyperlipidemia   . Osteoporosis   . Thyroid mass   . Thyroid nodule   . Vitamin D deficiency    Past Surgical History:  Procedure Laterality Date  . BIOPSY THYROID Bilateral 10/2014  . COLONOSCOPY  01/24/14  . ECTOPIC PREGNANCY SURGERY  1998  . ENDOMETRIAL ABLATION  2010  . LYMPH NODE DISSECTION N/A 11/29/2014   Procedure: LIMITED LYMPH NODE DISSECTION;  Surgeon: Armandina Gemma, MD;  Location: [facility];  Service: General;  Laterality: N/A;  . SALPINGOOPHORECTOMY     one side  . THYROIDECTOMY N/A 11/29/2014   Procedure: TOTAL THYROIDECTOMY LIMITED LYMPH NODE DISSECTION;  Surgeon: Armandina Gemma, MD;  Location: [facility];  Service: General;  Laterality: N/A;  . TUBAL LIGATION  2011   History   Social History  . Marital Status: Single    Spouse Name: N/A  . Number  of Children: 2   Occupational History  . Small business customer svs rep   Social History Main Topics  . Smoking status: Current Some Day Smoker  . Smokeless tobacco: Not on file  . Alcohol Use: Occasional, red wine  . Drug Use: No  . Sexual Activity: Yes    Birth Control/ Protection: Surgical   Social History Narrative   Bone Density Study: 09/06/13   Pap Smear: 12/2012   Mammogram: 09/2012      Current Outpatient Medications on File Prior to Visit  Medication Sig Dispense Refill  . acetaminophen (TYLENOL) 500 MG tablet Take 1,000 mg by mouth as needed.     Marland Kitchen amLODipine (NORVASC) 5 MG tablet Take 5 mg by mouth daily.    . Coenzyme Q10-Red Yeast Rice 60-600 MG CAPS Take 1 tablet by mouth 2 (two) times daily.    Marland Kitchen levothyroxine (SYNTHROID) 100 MCG tablet Take 1 tablet (100 mcg total) by mouth daily. 90 tablet 3  . OVER THE COUNTER MEDICATION Take 10 mLs by mouth every morning. Braggs Vinegar    . OVER THE COUNTER MEDICATION Take 1 tablet by mouth 2 (two) times daily. PM Phytogen Complex    . Tetrahydrozoline HCl (VISINE OP) Apply 1-2 drops to eye daily as needed (red dry eyes.).  No current facility-administered medications on file prior to visit.   No Known Allergies Family History  Problem Relation Age of Onset  . Diabetes Mother        DM Type II   . Hyperlipidemia Mother   . Hypertension Mother   . Diabetes Father        DM Type II   . Hyperlipidemia Father   . Heart disease Father   . Hypertension Father    PE: There were no vitals taken for this visit. There is no height or weight on file to calculate BMI. Wt Readings from Last 3 Encounters:  11/12/19 192 lb (87.1 kg)  04/26/19 194 lb 6.4 oz (88.2 kg)  09/22/18 204 lb 6 oz (92.7 kg)   Constitutional: overweight, in NAD Eyes: PERRLA, EOMI, no exophthalmos ENT: moist mucous membranes, no masses palpable in neck, no cervical lymphadenopathy Cardiovascular: RRR, No MRG Respiratory: CTA B Gastrointestinal:  abdomen soft, NT, ND, BS+ Musculoskeletal: no deformities, strength intact in all 4 Skin: moist, warm, no rashes Neurological: no tremor with outstretched hands, DTR normal in all 4  ASSESSMENT: 1. Follicular variant of Papillary thyroid cancer  2. Postsurgical hypothyroidism   PLAN: 1. FV of PTC -Patient with low risk papillary thyroid cancer, follicular variant.  This was unifocal, and he did not spread to the lymph nodes (0/7).  She had RAI treatment in 03/2015 and a posttreatment whole-body scan showed no metastases. -Latest neck ultrasound from 07/2016 also did not show any suspicious masses -No neck compression symptoms -We will repeat her TFTs today along with thyroglobulin and ATA -Her previous thyroglobulin levels have been undetectable -At this visit, I suggested to check another ultrasound, 5 years from the previous  2. Postsurgical hypothyroidism -Uncontrolled - latest thyroid labs reviewed with pt >> TSH was suppressed: Lab Results  Component Value Date   TSH 0.30 (L) 11/12/2019   - she continues on LT4 100 mcg daily -we did not change the dose at that time due to previous variability but I did advise the patient to come back for recheck.  She did not return... - pt feels good on this dose. - we discussed about taking the thyroid hormone every day, with water, >30 minutes before breakfast, separated by >4 hours from acid reflux medications, calcium, iron, multivitamins. Pt. is taking it correctly. - will check thyroid tests today: TSH and fT4 - If labs are abnormal, she will need to return for repeat TFTs in 1.5 months -Otherwise, I will see her back in 1 year  No orders of the defined types were placed in this encounter.  Philemon Kingdom, MD PhD [facility] Endocrinology

## 2020-12-22 ENCOUNTER — Other Ambulatory Visit: Payer: Self-pay | Admitting: Internal Medicine

## 2021-01-16 DIAGNOSIS — Z1231 Encounter for screening mammogram for malignant neoplasm of breast: Secondary | ICD-10-CM | POA: Diagnosis not present

## 2021-01-20 ENCOUNTER — Other Ambulatory Visit: Payer: Self-pay | Admitting: Internal Medicine

## 2021-02-17 DIAGNOSIS — E559 Vitamin D deficiency, unspecified: Secondary | ICD-10-CM | POA: Diagnosis not present

## 2021-02-17 DIAGNOSIS — E7800 Pure hypercholesterolemia, unspecified: Secondary | ICD-10-CM | POA: Diagnosis not present

## 2021-02-17 DIAGNOSIS — Z1159 Encounter for screening for other viral diseases: Secondary | ICD-10-CM | POA: Diagnosis not present

## 2021-02-17 DIAGNOSIS — E039 Hypothyroidism, unspecified: Secondary | ICD-10-CM | POA: Diagnosis not present

## 2021-02-17 DIAGNOSIS — Z79899 Other long term (current) drug therapy: Secondary | ICD-10-CM | POA: Diagnosis not present

## 2021-02-17 DIAGNOSIS — R300 Dysuria: Secondary | ICD-10-CM | POA: Diagnosis not present

## 2021-02-17 DIAGNOSIS — I10 Essential (primary) hypertension: Secondary | ICD-10-CM | POA: Diagnosis not present

## 2021-02-22 ENCOUNTER — Other Ambulatory Visit: Payer: Self-pay | Admitting: Internal Medicine

## 2021-02-27 ENCOUNTER — Telehealth: Payer: Self-pay | Admitting: Internal Medicine

## 2021-02-27 NOTE — Telephone Encounter (Signed)
Pt is asking if she needs an app for this medication her insurance is requiring a 90 day refill request on it before it is covered  levothyroxine (SYNTHROID) 100 MCG tablet  [id number] [place] [address]

## 2021-03-03 NOTE — Telephone Encounter (Signed)
LMTCB to schedule appointment °

## 2021-03-03 NOTE — Telephone Encounter (Signed)
Patient has been scheduled for an appointment 05/25/21 @ 320 PM

## 2021-03-18 NOTE — Telephone Encounter (Signed)
Patient called again re: Patient states that her [facility] sent request over 1 week ago for information to Dr. Cruzita Lederer in order for Patient to receive RX for Levothyroxine 90 day supply (must be 90 day supply per Patient's insurance-please see previous messages below) PHARM told Patient they have not received a response to date. Patient states her insurance will not pay for the RX listed above until required information has been received, therefore, Patient has to pay out of pocket.  Patient is scheduled for follow up appointment on 05/25/21.  "Patient states her insurance is requiring a 90 day refill request on it before it is covered for levothyroxine (SYNTHROID) 100 MCG tablet   [id number] [place] [address]

## 2021-03-18 NOTE — Telephone Encounter (Signed)
Pt called back and was advised 90 day supply can be sent after having labs. Pt verbalized understanding.

## 2021-03-18 NOTE — Telephone Encounter (Signed)
Called and left a message for pt to call back regarding last message. Called and spoke with pharmacy and pt can fill 30 day supply rx with discount card (cost 11.50) until pt can be seen in office. Pt needs to be seen and have labs before a 90 day supply Rx can be sent. Rx sent 02/24/21 for 30 day supply was picked up 03/15/21.

## 2021-04-06 ENCOUNTER — Other Ambulatory Visit: Payer: Self-pay | Admitting: Internal Medicine

## 2021-05-12 ENCOUNTER — Other Ambulatory Visit: Payer: Self-pay | Admitting: Internal Medicine

## 2021-05-25 ENCOUNTER — Encounter: Payer: Self-pay | Admitting: Internal Medicine

## 2021-05-25 ENCOUNTER — Ambulatory Visit (INDEPENDENT_AMBULATORY_CARE_PROVIDER_SITE_OTHER): Payer: 59 | Admitting: Internal Medicine

## 2021-05-25 ENCOUNTER — Other Ambulatory Visit: Payer: Self-pay

## 2021-05-25 VITALS — BP 120/76 | HR 65 | Ht 63.0 in | Wt 196.4 lb

## 2021-05-25 DIAGNOSIS — C73 Malignant neoplasm of thyroid gland: Secondary | ICD-10-CM | POA: Diagnosis not present

## 2021-05-25 DIAGNOSIS — E890 Postprocedural hypothyroidism: Secondary | ICD-10-CM

## 2021-05-25 NOTE — Progress Notes (Addendum)
Patient ID: Melinda Vazquez, female   DOB: 05-05-1966, 55 y.o.   MRN: [id number]  This visit occurred during the SARS-CoV-2 public health emergency.  Safety protocols were in place, including screening questions prior to the visit, additional usage of staff PPE, and extensive cleaning of exam room while observing appropriate contact time as indicated for disinfecting solutions.   HPI  Melinda Vazquez is a 55 y.o.-year-old female, returning for management of follicular variant of papillary thyroid cancer and postsurgical hypothyroidism. Last visit 1.5 years ago. Insurance: [employer].  Interim history: At this visit, she is feeling well, without complaints. No masses felt in her neck, choking, hoarseness, dysphagia.  Reviewed history: - Pt was seen by PCP in 06/2014 and c/o feeling a neck mass >> she was sent for a thyroid U/S. - Thyroid U/S ([facility], 06/26/2014): 3.7 x 4.1 x 1.9 cm, complex, vascular. - FNA (10/15/14):  Adequacy Reason Satisfactory For Evaluation. Diagnosis THYROID, FINE NEEDLE ASPIRATION, MIDLINE INFERIOR ISTHMUS (SPECIMEN 1 OF 1, COLLECTED ON 10/15/14): MALIGNANT ([place] CATEGORY VI). FINDINGS CONSISTENT WITH PAPILLARY THYROID CARCINOMA. [name] MD Pathologist, Electronic Signature (Case signed 10/16/2014) Specimen Clinical Information 3.7 x 4.1 x 1.9 cm heterogeneous hypervascular nodule inferior thyroid isthmus Source Thyroid, Fine Needle Aspiration, Midline Inferior Isthmus - Thyroidectomy (11/29/2014) - Dr Harlow Asa:   1. Thyroid, thyroidectomy, total - FOLLICULAR VARIANT OF PAPILLARY THYROID CARCINOMA, 3.7 CM, LIMITED TO THYROIDAL PARENCHYMA. - NO EVIDENCE OF ANGIOLYMPHATIC INVASION IDENTIFIED - RESECTION MARGINS, NEGATIVE FOR ATYPIA OR MALIGNANCY. 2. Lymph nodes, regional resection, central compartment lymph node VI - SEVEN LYMPH NODES, NEGATIVE FOR METASTATIC CARCINOMA (0/7) Microscopic Comment 1. THYROID Specimen: Thyroid gland Procedure: Total  thyroidectomy Specimen Integrity (intact/fragmented): Intact Tumor focality: Unifocal Maximum tumor size (cm): 3.7 cm, gross measurement Tumor laterality: Right lobe, mid to inferior Histologic type (including subtype and/or unique features as applicable): Follicular variant of papillary thyroid carcinoma Tumor capsule: Not present Extrathyroidal extension: No Margins: Negative Lymph - Vascular invasion: Not identified. Capsular invasion with degree of invasion if present: N/A Second and additional tumors: N/A Lymph nodes: # examined 7; # positive; 0 TNM code: pT2 , pN0 Non-neoplastic thyroid: Unremarkable. - RAI Tx (03/17/2015): 69.2 mCi  (done with Thyrogen) - post RAI tx WBS (03/24/2015): negative, except remnant activity in thyroid bed - 07/31/2016: Neck ultrasound: Post total thyroidectomy without evidence of nodular tissue within the resection bed to suggest locally recurrent or metastatic disease.  Lab Results  Component Value Date   THYROGLB 0.1 (L) 11/12/2019   THYROGLB <0.1 (L) 07/04/2017   THYROGLB 0.1 (L) 06/11/2016   THYROGLB 0.1 (L) 12/11/2015   THYROGLB <0.1 (L) 06/09/2015   THGAB <1 11/12/2019   THGAB <1 07/04/2017   THGAB <1 06/11/2016   THGAB <1 12/11/2015   THGAB <1 06/09/2015   Pt is on levothyroxine (now generic) 100 mcg daily, taken: - in am - fasting - 1-2 hours from b'fast - no Ca, Fe, PPIs - occasionally Multivitamins >4h later - at last visit she was on biotin (1700 mcg) >> now off Biotin  Latest TSH: Lab Results  Component Value Date   TSH 0.30 (L) 11/12/2019   TSH 0.49 07/04/2017   TSH 1.45 06/11/2016   TSH 1.44 12/11/2015   TSH 0.40 07/14/2015   TSH 0.20 (L) 02/06/2015   FREET4 1.06 11/12/2019   FREET4 0.85 07/04/2017   FREET4 1.1 06/11/2016   FREET4 0.88 12/11/2015   FREET4 0.78 07/14/2015   FREET4 0.94 02/06/2015  09/04/2018: TSH 28.027 (she was  off LT4 after she ran out) 06/26/2014: TSH 0.45  She has GERD >> saw ENT >> throat  irritation.  Pt does have a FH of thyroid ds. in daughter - thyroid cancer (? Type). No h/o radiation tx to head or neck.  She had a high HbA1c,of 6.5%. She improved her diet but still drinks juice -advised her to stop.  ROS: + See HPI  I reviewed pt's medications, allergies, PMH, social hx, family hx, and changes were documented in the history of present illness. Otherwise, unchanged from my initial visit note.  Past Medical History:  Diagnosis Date   Acute back pain    Acute hip pain    Anemia    hx of   Cancer ([place]) 10/2014   papillary thyroid carcinoma-radiation after surgery   Colon polyp    Degenerative disc disease, lumbar    L5-S1   Fatigue    Hyperlipidemia    Osteoporosis    Thyroid mass    Thyroid nodule    Vitamin D deficiency    Past Surgical History:  Procedure Laterality Date   BIOPSY THYROID Bilateral 10/2014   COLONOSCOPY  01/24/14   ECTOPIC PREGNANCY SURGERY  1998   ENDOMETRIAL ABLATION  2010   LYMPH NODE DISSECTION N/A 11/29/2014   Procedure: LIMITED LYMPH NODE DISSECTION;  Surgeon: Armandina Gemma, MD;  Location: [facility];  Service: General;  Laterality: N/A;   SALPINGOOPHORECTOMY     one side   THYROIDECTOMY N/A 11/29/2014   Procedure: TOTAL THYROIDECTOMY LIMITED LYMPH NODE DISSECTION;  Surgeon: Armandina Gemma, MD;  Location: [facility];  Service: General;  Laterality: N/A;   TUBAL LIGATION  2011   History   Social History   Marital Status: Single    Spouse Name: N/A   Number of Children: 2   Occupational History   Small business customer svs rep   Social History Main Topics   Smoking status: Current Some Day Smoker   Smokeless tobacco: Not on file   Alcohol Use: Occasional, red wine   Drug Use: No   Sexual Activity: Yes    Birth Control/ Protection: Surgical   Social History Narrative   Bone Density Study: 09/06/13   Pap Smear: 12/2012   Mammogram: 09/2012      Current Outpatient Medications on File Prior to Visit  Medication Sig Dispense Refill    acetaminophen (TYLENOL) 500 MG tablet Take 1,000 mg by mouth as needed.      amLODipine (NORVASC) 5 MG tablet Take 5 mg by mouth daily.     Coenzyme Q10-Red Yeast Rice 60-600 MG CAPS Take 1 tablet by mouth 2 (two) times daily.     levothyroxine (SYNTHROID) 100 MCG tablet TAKE 1 TABLET BY MOUTH EVERY DAY *REFILLS NOT COVERED 90 tablet 0   OVER THE COUNTER MEDICATION Take 10 mLs by mouth every morning. Braggs Vinegar     OVER THE COUNTER MEDICATION Take 1 tablet by mouth 2 (two) times daily. PM Phytogen Complex     Tetrahydrozoline HCl (VISINE OP) Apply 1-2 drops to eye daily as needed (red dry eyes.).     No current facility-administered medications on file prior to visit.   No Known Allergies Family History  Problem Relation Age of Onset   Diabetes Mother        DM Type II    Hyperlipidemia Mother    Hypertension Mother    Diabetes Father        DM Type II    Hyperlipidemia Father  Heart disease Father    Hypertension Father    PE: BP 120/76 (BP Location: Right Arm, Patient Position: Sitting, Cuff Size: Normal)   Pulse 65   Ht _0  (1.6 m)   Wt 196 lb 6.4 oz (89.1 kg)   SpO2 99%   BMI 34.79 kg/m  Body mass index is 34.79 kg/m. Wt Readings from Last 3 Encounters:  05/25/21 196 lb 6.4 oz (89.1 kg)  11/12/19 192 lb (87.1 kg)  04/26/19 194 lb 6.4 oz (88.2 kg)   Constitutional: overweight, in NAD Eyes: PERRLA, EOMI, no exophthalmos ENT: moist mucous membranes, no masses palpable in neck, no cervical lymphadenopathy Cardiovascular: RRR, No MRG Respiratory: CTA B Gastrointestinal: abdomen soft, NT, ND, BS+ Musculoskeletal: no deformities, strength intact in all 4 Skin: moist, warm, no rashes Neurological: no tremor with outstretched hands, DTR normal in all 4  ASSESSMENT: 1. Follicular variant of Papillary thyroid cancer  2. Postsurgical hypothyroidism   PLAN: 1. FV of PTC - Patient has low risk papillary thyroid cancer, the follicular variant.  This was also  unifocal, it did not spread to the lymph vessels (0 out of 7 lymph nodes analyzed), she had RAI treatment in 03/2015, and posttreatment whole-body scan showed no metastases. -Reviewed the latest thyroid ultrasound report and this did not show any recurrences or metastasis in her neck -No neck compression symptoms but has a slight sensation of pulling in the right side of her scar, which is chronic.  This is most likely normal and due to scarring of the tissue behind the surgical incision -At today's visit, we will repeat her TFTs along with thyroglobulin and ATA. -Previous thyroglobulin levels have been undetectable or barely detectable, at 0.1 -At this visit, we discussed about repeating another neck Ultrasound  2. Postsurgical hypothyroidism - latest thyroid labs reviewed with pt >> normal in 2018, however, since then, she had another TSH that was very high, at 28.027 on 09/04/2018.  I was not aware of these results at that time, just saw this in her chart now... She tells me that at that time she was off levothyroxine for few days after running out.  - latest thyroid labs reviewed with pt. >> slightly low, but we continued the same dose of levothyroxine as her thyroglobulin was detectable: Lab Results  Component Value Date   TSH 0.30 (L) 11/12/2019  - she continues on LT4 100 mcg daily - pt felt better when she was able to take brand-name Synthroid, we have to continue her generic formulation due to price. Now she tells me that she got used to the generic formulation and does not feel differently on it. - we discussed about taking the thyroid hormone every day, with water, >30 minutes before breakfast, separated by >4 hours from acid reflux medications, calcium, iron, multivitamins. Pt. is taking it correctly. - will check thyroid tests today: TSH and fT4 - If labs are abnormal, she will need to return for repeat TFTs in 1.5 months  Needs refills for 90 days!  Component     Latest Ref Rng &  Units 05/25/2021  Thyroglobulin     ng/mL 0.1 (L)  Comment        Thyroglobulin Ab     < or = 1 IU/mL <1  TSH     0.35 - 5.50 uIU/mL 0.44  T4,Free(Direct)     0.60 - 1.60 ng/dL 0.90  Thyroid tests are normal.  Thyroglobulin detectable, but very low.  Neck U/S (06/24/2021): FINDINGS: Sonographic evaluation  of the neck demonstrates no evidence of retained thyroid tissue or abnormal tissue to suggest recurrent carcinoma at the level of the thyroid bed. No abnormal appearing or enlarged lymph nodes are identified.   IMPRESSION: No evidence of residual thyroid tissue or recurrent thyroid carcinoma by ultrasound.    Philemon Kingdom, MD PhD [facility]

## 2021-05-25 NOTE — Patient Instructions (Signed)
Please stop at the lab.  Please continue Levothyroxine 100 mcg daily.  Take the thyroid hormone every day, with water, at least 30 minutes before breakfast, separated by at least 4 hours from: - acid reflux medications - calcium - iron - multivitamins  Please return in 1 year.

## 2021-05-26 LAB — THYROGLOBULIN ANTIBODY: Thyroglobulin Ab: <1 IU/mL (ref <=1)

## 2021-05-26 LAB — THYROGLOBULIN LEVEL: Thyroglobulin: 0.1 ng/mL — ABNORMAL LOW

## 2021-05-26 LAB — TSH: TSH: 0.44 u[IU]/mL (ref 0.35–5.50)

## 2021-05-26 LAB — T4, FREE: Free T4: 0.9 ng/dL (ref 0.60–1.60)

## 2021-05-26 MED ORDER — LEVOTHYROXINE SODIUM 100 MCG PO TABS: ORAL_TABLET | ORAL | 3 refills | Status: DC

## 2021-05-27 ENCOUNTER — Telehealth: Payer: Self-pay

## 2021-05-27 NOTE — Telephone Encounter (Signed)
This is expected after taking the thyroid out since the thyroid is the only tissue that produces thyroglobulin.  It is a good result!

## 2021-05-27 NOTE — Telephone Encounter (Signed)
Patient returned call-transferred to [facility]

## 2021-05-27 NOTE — Telephone Encounter (Addendum)
Called and left detailed message for pt with the following information ----- Message from Philemon Kingdom, MD sent at 05/26/2021  4:48 PM EDT ----- Can you please call pt.:  Thyroid tests are normal.  Thyroglobulin is very low.  I sent a new prescription for her levothyroxine for 3 months to her pharmacy.

## 2021-06-12 ENCOUNTER — Other Ambulatory Visit: Payer: 59

## 2021-06-24 ENCOUNTER — Ambulatory Visit
Admission: RE | Admit: 2021-06-24 | Discharge: 2021-06-24 | Disposition: A | Discharge status: Home / Self Care | Payer: 59 | Source: Ambulatory Visit | Attending: Internal Medicine | Admitting: Internal Medicine

## 2021-06-24 DIAGNOSIS — C73 Malignant neoplasm of thyroid gland: Secondary | ICD-10-CM

## 2021-06-24 DIAGNOSIS — E890 Postprocedural hypothyroidism: Secondary | ICD-10-CM | POA: Diagnosis not present

## 2021-06-29 ENCOUNTER — Telehealth: Payer: Self-pay

## 2021-06-29 NOTE — Telephone Encounter (Addendum)
Called and left a detailed message for pt with results. ----- Message from Philemon Kingdom, MD sent at 06/29/2021 12:46 PM EST ----- Can you please call pt.:  Her neck ultrasound results returned and they do not show any thyroid cancer recurrence or other suspicious masses in her neck.

## 2021-08-25 NOTE — Telephone Encounter (Signed)
Patient was notified and no further concerns

## 2021-10-19 DIAGNOSIS — K5730 Diverticulosis of large intestine without perforation or abscess without bleeding: Secondary | ICD-10-CM | POA: Diagnosis not present

## 2021-10-19 DIAGNOSIS — K648 Other hemorrhoids: Secondary | ICD-10-CM | POA: Diagnosis not present

## 2021-10-19 DIAGNOSIS — K640 First degree hemorrhoids: Secondary | ICD-10-CM | POA: Diagnosis not present

## 2021-10-19 DIAGNOSIS — Z1211 Encounter for screening for malignant neoplasm of colon: Secondary | ICD-10-CM | POA: Diagnosis not present

## 2021-10-19 DIAGNOSIS — K635 Polyp of colon: Secondary | ICD-10-CM | POA: Diagnosis not present

## 2021-10-19 DIAGNOSIS — D125 Benign neoplasm of sigmoid colon: Secondary | ICD-10-CM | POA: Diagnosis not present

## 2021-10-19 DIAGNOSIS — K5700 Diverticulitis of small intestine with perforation and abscess without bleeding: Secondary | ICD-10-CM | POA: Diagnosis not present

## 2021-10-19 DIAGNOSIS — Z86010 Personal history of colonic polyps: Secondary | ICD-10-CM | POA: Diagnosis not present

## 2021-11-02 ENCOUNTER — Ambulatory Visit: Payer: 59 | Admitting: Podiatry

## 2021-11-11 ENCOUNTER — Ambulatory Visit (INDEPENDENT_AMBULATORY_CARE_PROVIDER_SITE_OTHER): Payer: 59 | Admitting: Podiatry

## 2021-11-11 DIAGNOSIS — M2041 Other hammer toe(s) (acquired), right foot: Secondary | ICD-10-CM | POA: Diagnosis not present

## 2021-11-11 DIAGNOSIS — M2042 Other hammer toe(s) (acquired), left foot: Secondary | ICD-10-CM | POA: Diagnosis not present

## 2021-11-11 NOTE — Progress Notes (Signed)
?Subjective:  ?Patient ID: Melinda Vazquez, female    DOB: [birth year],  MRN: [id number] ? ?Chief Complaint  ?Patient presents with  ? Callouses  ? ? ?56 y.o. female presents with the above complaint.  Patient presents with complaint bilateral fifth digit hyperkeratotic lesion/hammertoe contracture.  Patient states been present for quite some time is progressive gotten worse.  She wanted get it evaluated she would like to have removed she has not seen anyone else prior to seeing me for this.  She is tried some shoe gear modification none of which has helped.  She tends to wear very tight toebox shoes.  Cyst on become discomfort and painful.  She has not tried any toe protector and other conservative treatment options ? ? ?Review of Systems: Negative except as noted in the HPI. Denies N/V/F/Ch. ? ?Past Medical History:  ?Diagnosis Date  ? Acute back pain   ? Acute hip pain   ? Anemia   ? hx of  ? Cancer ([place]) 10/2014  ? papillary thyroid carcinoma-radiation after surgery  ? Colon polyp   ? Degenerative disc disease, lumbar   ? L5-S1  ? Fatigue   ? Hyperlipidemia   ? Osteoporosis   ? Thyroid mass   ? Thyroid nodule   ? Vitamin D deficiency   ? ? ?Current Outpatient Medications:  ?  acetaminophen (TYLENOL) 500 MG tablet, Take 1,000 mg by mouth as needed. , Disp: , Rfl:  ?  amLODipine (NORVASC) 5 MG tablet, Take 5 mg by mouth daily., Disp: , Rfl:  ?  Coenzyme Q10-Red Yeast Rice 60-600 MG CAPS, Take 1 tablet by mouth 2 (two) times daily., Disp: , Rfl:  ?  levothyroxine (SYNTHROID) 100 MCG tablet, TAKE 1 TABLET BY MOUTH EVERY DAY *REFILLS NOT COVERED, Disp: 90 tablet, Rfl: 3 ?  OVER THE COUNTER MEDICATION, Take 10 mLs by mouth every morning. Braggs Vinegar, Disp: , Rfl:  ?  OVER THE COUNTER MEDICATION, Take 1 tablet by mouth 2 (two) times daily. PM Phytogen Complex, Disp: , Rfl:  ?  Tetrahydrozoline HCl (VISINE OP), Apply 1-2 drops to eye daily as needed (red dry eyes.)., Disp: , Rfl:  ? ?Social History  ? ?Tobacco Use  ?Smoking  Status Former  ? Types: Cigarettes  ? Quit date: 09/23/2015  ? Years since quitting: 6.1  ?Smokeless Tobacco Never  ?Tobacco Comments  ? social smoker  ? ? ?No Known Allergies ?Objective:  ?There were no vitals filed for this visit. ?There is no height or weight on file to calculate BMI. ?Constitutional Well developed. ?Well nourished.  ?Vascular Dorsalis pedis pulses palpable bilaterally. ?Posterior tibial pulses palpable bilaterally. ?Capillary refill normal to all digits.  ?No cyanosis or clubbing noted. ?Pedal hair growth normal.  ?Neurologic Normal speech. ?Oriented to person, place, and time. ?Epicritic sensation to light touch grossly present bilaterally.  ?Dermatologic Hyperkeratotic lesion with underlying hammertoe contracture bilateral fifth digit.  Similar flexibility noted to the fifth digit.  Pain on palpation to the lesion.  ?Orthopedic: Normal joint ROM without pain or crepitus bilaterally. ?No visible deformities. ?No bony tenderness.  ? ?Radiographs: None ?Assessment:  ? ?1. Hammertoe, bilateral   ? ?Plan:  ?Patient was evaluated and treated and all questions answered. ? ?Bilateral fifth digit hammertoe contracture ?-All questions and concerns were discussed with the patient in extensive detail given the amount of pain that she is having I believe she will benefit from shoe gear modification as well as padding and protecting.  I discussed surgical plans  as well.  She would like to think about the surgery and will get back to me when she is ready. ? ?No follow-ups on file. ?

## 2022-01-18 DIAGNOSIS — Z1239 Encounter for other screening for malignant neoplasm of breast: Secondary | ICD-10-CM | POA: Diagnosis not present

## 2022-01-18 DIAGNOSIS — Z1231 Encounter for screening mammogram for malignant neoplasm of breast: Secondary | ICD-10-CM | POA: Diagnosis not present

## 2022-03-25 ENCOUNTER — Telehealth: Payer: Self-pay

## 2022-03-25 NOTE — Telephone Encounter (Signed)
Pt contacted and advised appt can be scheduled and pt can be placed on wait list if requested.

## 2022-03-25 NOTE — Telephone Encounter (Signed)
Yes, I talked to [facility] yesterday.  Let's  give her an appointment in November or December.

## 2022-03-25 NOTE — Telephone Encounter (Signed)
Pt lvm requesting a call back to discuss referral for her daughter to be seen by Dr Cruzita Lederer. She states at her last appt she asked if her daughter could be seen as a new pt.

## 2022-05-26 ENCOUNTER — Ambulatory Visit: Payer: 59 | Admitting: Internal Medicine

## 2022-05-31 DIAGNOSIS — Z0000 Encounter for general adult medical examination without abnormal findings: Secondary | ICD-10-CM | POA: Diagnosis not present

## 2022-05-31 DIAGNOSIS — Z01419 Encounter for gynecological examination (general) (routine) without abnormal findings: Secondary | ICD-10-CM | POA: Diagnosis not present

## 2022-05-31 DIAGNOSIS — Z7689 Persons encountering health services in other specified circumstances: Secondary | ICD-10-CM | POA: Diagnosis not present

## 2022-06-18 DIAGNOSIS — B009 Herpesviral infection, unspecified: Secondary | ICD-10-CM | POA: Diagnosis not present

## 2022-08-31 ENCOUNTER — Other Ambulatory Visit: Payer: Self-pay | Admitting: Internal Medicine

## 2022-09-02 ENCOUNTER — Telehealth: Payer: Self-pay | Admitting: Internal Medicine

## 2022-09-02 ENCOUNTER — Other Ambulatory Visit: Payer: Self-pay | Admitting: Internal Medicine

## 2022-09-02 NOTE — Telephone Encounter (Signed)
RX sent.

## 2022-09-02 NOTE — Telephone Encounter (Signed)
New message    1. Which medications need to be refilled? (please list name of each medication and dose if known) levothyroxine (SYNTHROID) 100 MCG tablet  2. Which pharmacy/location (including street and city if local pharmacy) is medication to be sent to? [id number]- [place], [place] - [id number]   3. Do they need a 30 day or 90 day supply? 90 day supply

## 2022-10-01 DIAGNOSIS — E039 Hypothyroidism, unspecified: Secondary | ICD-10-CM | POA: Diagnosis not present

## 2022-10-01 DIAGNOSIS — Z0000 Encounter for general adult medical examination without abnormal findings: Secondary | ICD-10-CM | POA: Diagnosis not present

## 2022-10-15 ENCOUNTER — Ambulatory Visit (INDEPENDENT_AMBULATORY_CARE_PROVIDER_SITE_OTHER): Payer: 59 | Admitting: Internal Medicine

## 2022-10-15 ENCOUNTER — Encounter: Payer: Self-pay | Admitting: Internal Medicine

## 2022-10-15 VITALS — BP 132/82 | Ht 63.0 in | Wt 199.6 lb

## 2022-10-15 DIAGNOSIS — C73 Malignant neoplasm of thyroid gland: Secondary | ICD-10-CM | POA: Diagnosis not present

## 2022-10-15 DIAGNOSIS — E890 Postprocedural hypothyroidism: Secondary | ICD-10-CM

## 2022-10-15 NOTE — Progress Notes (Unsigned)
Patient ID: Melinda Vazquez, female   DOB: 11-Dec-1965, 57 y.o.   MRN: [id number]  HPI  Melinda Vazquez is a 57 y.o.-year-old female, returning for management of follicular variant of papillary thyroid cancer and postsurgical hypothyroidism. Last visit 1.5 years ago.  Previous visit was also 1.5 years prior. Insurance: [facility].  Interim history: At this visit, she is feeling well, without complaints. No masses felt in her neck, choking, hoarseness, dysphagia. She has occasional acid acid reflux. She feels she gained some weight/3 pounds by our scale.  Reviewed history: - Pt was seen by PCP in 06/2014 and c/o feeling a neck mass >> she was sent for a thyroid U/S. - Thyroid U/S ([facility], 06/26/2014): 3.7 x 4.1 x 1.9 cm, complex, vascular. - FNA (10/15/14):  Adequacy Reason Satisfactory For Evaluation. Diagnosis THYROID, FINE NEEDLE ASPIRATION, MIDLINE INFERIOR ISTHMUS (SPECIMEN 1 OF 1, COLLECTED ON 10/15/14): MALIGNANT (BETHESDA CATEGORY VI). FINDINGS CONSISTENT WITH PAPILLARY THYROID CARCINOMA. [name] MD Pathologist, Electronic Signature (Case signed 10/16/2014) Specimen Clinical Information 3.7 x 4.1 x 1.9 cm heterogeneous hypervascular nodule inferior thyroid isthmus Source Thyroid, Fine Needle Aspiration, Midline Inferior Isthmus - Thyroidectomy (11/29/2014) - Dr Harlow Asa:   1. Thyroid, thyroidectomy, total - FOLLICULAR VARIANT OF PAPILLARY THYROID CARCINOMA, 3.7 CM, LIMITED TO THYROIDAL PARENCHYMA. - NO EVIDENCE OF ANGIOLYMPHATIC INVASION IDENTIFIED - RESECTION MARGINS, NEGATIVE FOR ATYPIA OR MALIGNANCY. 2. Lymph nodes, regional resection, central compartment lymph node VI - SEVEN LYMPH NODES, NEGATIVE FOR METASTATIC CARCINOMA (0/7) Microscopic Comment 1. THYROID Specimen: Thyroid gland Procedure: Total thyroidectomy Specimen Integrity (intact/fragmented): Intact Tumor focality: Unifocal Maximum tumor size (cm): 3.7 cm, gross measurement Tumor laterality: Right lobe, mid to  inferior Histologic type (including subtype and/or unique features as applicable): Follicular variant of papillary thyroid carcinoma Tumor capsule: Not present Extrathyroidal extension: No Margins: Negative Lymph - Vascular invasion: Not identified. Capsular invasion with degree of invasion if present: N/A Second and additional tumors: N/A Lymph nodes: # examined 7; # positive; 0 TNM code: pT2 , pN0 Non-neoplastic thyroid: Unremarkable. - RAI Tx (03/17/2015): 69.2 mCi  (done with Thyrogen) - post RAI tx WBS (03/24/2015): negative, except remnant activity in thyroid bed - 07/31/2016: Neck ultrasound: Post total thyroidectomy without evidence of nodular tissue within the resection bed to suggest locally recurrent or metastatic disease. - Neck U/S (06/24/2021): FINDINGS: Sonographic evaluation of the neck demonstrates no evidence of retained thyroid tissue or abnormal tissue to suggest recurrent carcinoma at the level of the thyroid bed. No abnormal appearing or enlarged lymph nodes are identified.   IMPRESSION: No evidence of residual thyroid tissue or recurrent thyroid carcinoma by ultrasound.  Lab Results  Component Value Date   THYROGLB 0.1 (L) 05/25/2021   THYROGLB 0.1 (L) 11/12/2019   THYROGLB <0.1 (L) 07/04/2017   THYROGLB 0.1 (L) 06/11/2016   THYROGLB 0.1 (L) 12/11/2015   THYROGLB <0.1 (L) 06/09/2015   THGAB <1 05/25/2021   THGAB <1 11/12/2019   THGAB <1 07/04/2017   THGAB <1 06/11/2016   THGAB <1 12/11/2015   THGAB <1 06/09/2015   Pt is on levothyroxine (now generic) 100 mcg daily, taken: - misses an occasional dose - in am - fasting - 1-2 hours from b'fast - no Ca, Fe, PPIs - occasionally Multivitamins >4h later - at last visit she was on biotin (1700 mcg) >> now off Biotin - + a probiotic  Latest TSH: Lab Results  Component Value Date   TSH 0.44 05/25/2021   TSH 0.30 (L) 11/12/2019   TSH  0.49 07/04/2017   TSH 1.45 06/11/2016   TSH 1.44 12/11/2015    TSH 0.40 07/14/2015   TSH 0.20 (L) 02/06/2015   FREET4 0.90 05/25/2021   FREET4 1.06 11/12/2019   FREET4 0.85 07/04/2017   FREET4 1.1 06/11/2016   FREET4 0.88 12/11/2015   FREET4 0.78 07/14/2015   FREET4 0.94 02/06/2015  09/04/2018: TSH 28.027 (she was off LT4 after she ran out) 06/26/2014: TSH 0.45  She has GERD >> saw ENT >> throat irritation.  Pt does have a FH of thyroid ds. in daughter - thyroid cancer (? Type). No h/o radiation tx to head or neck.  She had a high HbA1c,of 6.5%. Most recent reportedly 6.0%. She was drinking juice before >> reduced amounts.  ROS: + See HPI  I reviewed pt's medications, allergies, PMH, social hx, family hx, and changes were documented in the history of present illness. Otherwise, unchanged from my initial visit note.  Past Medical History:  Diagnosis Date   Acute back pain    Acute hip pain    Anemia    hx of   Cancer ([place]) 10/2014   papillary thyroid carcinoma-radiation after surgery   Colon polyp    Degenerative disc disease, lumbar    L5-S1   Fatigue    Hyperlipidemia    Osteoporosis    Thyroid mass    Thyroid nodule    Vitamin D deficiency    Past Surgical History:  Procedure Laterality Date   BIOPSY THYROID Bilateral 10/2014   COLONOSCOPY  01/24/14   ECTOPIC PREGNANCY SURGERY  1998   ENDOMETRIAL ABLATION  2010   LYMPH NODE DISSECTION N/A 11/29/2014   Procedure: LIMITED LYMPH NODE DISSECTION;  Surgeon: Armandina Gemma, MD;  Location: [facility];  Service: General;  Laterality: N/A;   SALPINGOOPHORECTOMY     one side   THYROIDECTOMY N/A 11/29/2014   Procedure: TOTAL THYROIDECTOMY LIMITED LYMPH NODE DISSECTION;  Surgeon: Armandina Gemma, MD;  Location: [facility];  Service: General;  Laterality: N/A;   TUBAL LIGATION  2011   History   Social History   Marital Status: Single    Spouse Name: N/A   Number of Children: 2   Occupational History   Small business customer svs rep   Social History Main Topics   Smoking status: Current Some Day  Smoker   Smokeless tobacco: Not on file   Alcohol Use: Occasional, red wine   Drug Use: No   Sexual Activity: Yes    Birth Control/ Protection: Surgical   Social History Narrative   Bone Density Study: 09/06/13   Pap Smear: 12/2012   Mammogram: 09/2012      Current Outpatient Medications on File Prior to Visit  Medication Sig Dispense Refill   acetaminophen (TYLENOL) 500 MG tablet Take 1,000 mg by mouth as needed.      amLODipine (NORVASC) 5 MG tablet Take 5 mg by mouth daily.     Coenzyme Q10-Red Yeast Rice 60-600 MG CAPS Take 1 tablet by mouth 2 (two) times daily.     levothyroxine (SYNTHROID) 100 MCG tablet TAKE 1 TABLET BY MOUTH EVERY DAY *REFILLS NOT COVERED 90 tablet 2   OVER THE COUNTER MEDICATION Take 10 mLs by mouth every morning. Braggs Vinegar     OVER THE COUNTER MEDICATION Take 1 tablet by mouth 2 (two) times daily. PM Phytogen Complex     Tetrahydrozoline HCl (VISINE OP) Apply 1-2 drops to eye daily as needed (red dry eyes.).     No current facility-administered  medications on file prior to visit.   No Known Allergies Family History  Problem Relation Age of Onset   Diabetes Mother        DM Type II    Hyperlipidemia Mother    Hypertension Mother    Diabetes Father        DM Type II    Hyperlipidemia Father    Heart disease Father    Hypertension Father    PE: BP 132/82 (BP Location: Right Arm, Patient Position: Sitting, Cuff Size: Normal)   Ht 5\' 3"  (1.6 m)   Wt 199 lb 9.6 oz (90.5 kg)   BMI 35.36 kg/m   Wt Readings from Last 3 Encounters:  10/15/22 199 lb 9.6 oz (90.5 kg)  05/25/21 196 lb 6.4 oz (89.1 kg)  11/12/19 192 lb (87.1 kg)   Constitutional: overweight, in NAD Eyes:  EOMI, no exophthalmos ENT: no neck masses, no cervical lymphadenopathy Cardiovascular: RRR, No MRG Respiratory: CTA B Musculoskeletal: no deformities Skin:no rashes Neurological: no tremor with outstretched hands  ASSESSMENT: 1. Follicular variant of Papillary thyroid  cancer  2. Postsurgical hypothyroidism   PLAN: 1. FV of PTC - Patient has low risk papillary thyroid cancer, the follicular variant.  This was also unifocal, it did not spread to the lymph vessels (0 out of 7 lymph nodes analyzed), she had RAI treatment in 03/2015, and posttreatment whole-body scan showed no metastases. -Reviewed the latest thyroid ultrasound report and this did not show any recurrences or metastasis in her neck -No neck compression symptoms but has a slight sensation of pulling in the right side of her scar, which is chronic.  This is most likely normal and due to scarring of the tissue behind the surgical incision -At today's visit, we will repeat her TFTs along with thyroglobulin and ATA. -Previous thyroglobulin levels have been undetectable or barely detectable, at 0.1 -At this visit, we discussed about repeating another neck ultrasound  2. Postsurgical hypothyroidism - latest thyroid labs reviewed with pt >> normal in 2018, however, since then, she had another TSH that was very high, at 28.027 on 09/04/2018.  I was not aware of these results at that time, just saw this in her chart now... She tells me that at that time she was off levothyroxine for few days after running out.  - latest thyroid labs reviewed with pt. >> slightly low, but we continued the same dose of levothyroxine as her thyroglobulin was detectable: Lab Results  Component Value Date   TSH 0.44 05/25/2021  - she continues on LT4 100 mcg daily - pt felt better when she was able to take brand-name Synthroid, we have to continue her generic formulation due to price. Now she tells me that she got used to the generic formulation and does not feel differently on it. - we discussed about taking the thyroid hormone every day, with water, >30 minutes before breakfast, separated by >4 hours from acid reflux medications, calcium, iron, multivitamins. Pt. is taking it correctly.  However, she occasionally misses doses.  I  advised her that if this happens, she can take 2 tablets the next day.  She agrees to do this. - will check thyroid tests today: TSH and fT4 - If labs are abnormal, she will need to return for repeat TFTs in 1.5 months  Needs refills for 90 days!  Philemon Kingdom, MD PhD [facility]ocr[facility]ology

## 2022-10-15 NOTE — Patient Instructions (Signed)
Please stop at the lab.  Please continue Levothyroxine 100 mcg daily.  Take the thyroid hormone every day, with water, at least 30 minutes before breakfast, separated by at least 4 hours from: - acid reflux medications - calcium - iron - multivitamins  Please return in 1 year.  

## 2022-10-18 LAB — T4, FREE: Free T4: 1.1 ng/dL (ref 0.8–1.8)

## 2022-10-18 LAB — TSH: TSH: 0.69 mIU/L (ref 0.40–4.50)

## 2022-10-18 LAB — THYROGLOBULIN LEVEL: Thyroglobulin: 0.1 ng/mL — ABNORMAL LOW

## 2022-10-18 LAB — THYROGLOBULIN ANTIBODY: Thyroglobulin Ab: <1 IU/mL (ref <=1)

## 2022-10-19 ENCOUNTER — Telehealth: Payer: Self-pay

## 2022-10-19 NOTE — Telephone Encounter (Signed)
Pt had questions about Ozempic and if that is something she can/should take. What all it could help with.

## 2022-10-19 NOTE — Telephone Encounter (Signed)
From her thyroid point of view, there is no problem.  However, she needs to make sure her's daughter thyroid cancer is not medullary, In which case, Ozempic would be contraindicated.  I do not usually prescribe this unless I manage patient's diabetes, but it could be prescribed by PCP or the weight management clinic.

## 2022-10-22 NOTE — Telephone Encounter (Signed)
Pt contacted and advised From her thyroid point of view, there is no problem.  However, she needs to make sure her's daughter thyroid cancer is not medullary, In which case, Ozempic would be contraindicated.  I do not usually prescribe this unless I manage patient's diabetes, but it could be prescribed by PCP or the weight management clinic.

## 2023-04-18 DIAGNOSIS — I10 Essential (primary) hypertension: Secondary | ICD-10-CM | POA: Diagnosis not present

## 2023-04-18 DIAGNOSIS — E039 Hypothyroidism, unspecified: Secondary | ICD-10-CM | POA: Diagnosis not present

## 2023-04-18 DIAGNOSIS — E7800 Pure hypercholesterolemia, unspecified: Secondary | ICD-10-CM | POA: Diagnosis not present

## 2023-04-18 DIAGNOSIS — Z1231 Encounter for screening mammogram for malignant neoplasm of breast: Secondary | ICD-10-CM | POA: Diagnosis not present

## 2023-04-18 DIAGNOSIS — N1831 Chronic kidney disease, stage 3a: Secondary | ICD-10-CM | POA: Diagnosis not present

## 2023-06-13 ENCOUNTER — Other Ambulatory Visit: Payer: Self-pay | Admitting: Internal Medicine

## 2023-10-17 ENCOUNTER — Ambulatory Visit (INDEPENDENT_AMBULATORY_CARE_PROVIDER_SITE_OTHER): Payer: 59 | Admitting: Internal Medicine

## 2023-10-17 ENCOUNTER — Encounter: Payer: Self-pay | Admitting: Internal Medicine

## 2023-10-17 VITALS — BP 120/64 | HR 67 | Ht 63.0 in | Wt 201.0 lb

## 2023-10-17 DIAGNOSIS — E890 Postprocedural hypothyroidism: Secondary | ICD-10-CM

## 2023-10-17 DIAGNOSIS — C73 Malignant neoplasm of thyroid gland: Secondary | ICD-10-CM

## 2023-10-17 NOTE — Progress Notes (Addendum)
 Patient ID: Melinda Vazquez, female   DOB: [birth year], 58 y.o.   MRN: [id number]  HPI  Melinda Vazquez is a 59 y.o.-year-old female, returning for management of follicular variant of papillary thyroid cancer and postsurgical hypothyroidism. Last visit 1 year ago Insurance: [facility].  Interim history: At this visit, she is feeling well, except: hair loss (increased in last 4 mo, to start to use a shampoo with Rosemary and biotin) and weight gain. No masses felt in her neck, choking, hoarseness, dysphagia. She had occasional acid reflux in the past >> resolved.  Reviewed history: - Pt was seen by PCP in 06/2014 and c/o feeling a neck mass >> she was sent for a thyroid U/S. - Thyroid U/S ([facility], 06/26/2014): 3.7 x 4.1 x 1.9 cm, complex, vascular. - FNA (10/15/14):  Adequacy Reason Satisfactory For Evaluation. Diagnosis THYROID, FINE NEEDLE ASPIRATION, MIDLINE INFERIOR ISTHMUS (SPECIMEN 1 OF 1, COLLECTED ON 10/15/14): MALIGNANT (BETHESDA CATEGORY VI). FINDINGS CONSISTENT WITH PAPILLARY THYROID CARCINOMA. Jimmy Picket MD Pathologist, Electronic Signature (Case signed 10/16/2014) Specimen Clinical Information 3.7 x 4.1 x 1.9 cm heterogeneous hypervascular nodule inferior thyroid isthmus Source Thyroid, Fine Needle Aspiration, Midline Inferior Isthmus - Thyroidectomy (11/29/2014) - Dr Gerrit Friends:   1. Thyroid, thyroidectomy, total - FOLLICULAR VARIANT OF PAPILLARY THYROID CARCINOMA, 3.7 CM, LIMITED TO THYROIDAL PARENCHYMA. - NO EVIDENCE OF ANGIOLYMPHATIC INVASION IDENTIFIED - RESECTION MARGINS, NEGATIVE FOR ATYPIA OR MALIGNANCY. 2. Lymph nodes, regional resection, central compartment lymph node VI - SEVEN LYMPH NODES, NEGATIVE FOR METASTATIC CARCINOMA (0/7) Microscopic Comment 1. THYROID Specimen: Thyroid gland Procedure: Total thyroidectomy Specimen Integrity (intact/fragmented): Intact Tumor focality: Unifocal Maximum tumor size (cm): 3.7 cm, gross measurement Tumor laterality: Right lobe, mid  to inferior Histologic type (including subtype and/or unique features as applicable): Follicular variant of papillary thyroid carcinoma Tumor capsule: Not present Extrathyroidal extension: No Margins: Negative Lymph - Vascular invasion: Not identified. Capsular invasion with degree of invasion if present: N/A Second and additional tumors: N/A Lymph nodes: # examined 7; # positive; 0 TNM code: pT2 , pN0 Non-neoplastic thyroid: Unremarkable. - RAI Tx (03/17/2015): 69.2 mCi  (done with Thyrogen) - post RAI tx WBS (03/24/2015): negative, except remnant activity in thyroid bed - 07/31/2016: Neck ultrasound: Post total thyroidectomy without evidence of nodular tissue within the resection bed to suggest locally recurrent or metastatic disease. - Neck U/S (06/24/2021): FINDINGS: Sonographic evaluation of the neck demonstrates no evidence of retained thyroid tissue or abnormal tissue to suggest recurrent carcinoma at the level of the thyroid bed. No abnormal appearing or enlarged lymph nodes are identified.   IMPRESSION: No evidence of residual thyroid tissue or recurrent thyroid carcinoma by ultrasound.  Lab Results  Component Value Date   THYROGLB 0.1 (L) 10/15/2022   THYROGLB 0.1 (L) 05/25/2021   THYROGLB 0.1 (L) 11/12/2019   THYROGLB <0.1 (L) 07/04/2017   THYROGLB 0.1 (L) 06/11/2016   THYROGLB 0.1 (L) 12/11/2015   THYROGLB <0.1 (L) 06/09/2015   THGAB <1 10/15/2022   THGAB <1 05/25/2021   THGAB <1 11/12/2019   THGAB <1 07/04/2017   THGAB <1 06/11/2016   THGAB <1 12/11/2015   THGAB <1 06/09/2015   Pt is on levothyroxine (now generic) 100 mcg daily, taken: - misses an occasional dose - in am - fasting - 1-2 hours from b'fast - no Ca, Fe, PPIs - occasionally Multivitamins >4h later - at last visit she was on biotin (1700 mcg) >> now off Biotin  Latest TSH: Lab Results  Component Value Date  TSH 0.69 10/15/2022   TSH 0.44 05/25/2021   TSH 0.30 (L) 11/12/2019   TSH  0.49 07/04/2017   TSH 1.45 06/11/2016   TSH 1.44 12/11/2015   TSH 0.40 07/14/2015   FREET4 1.1 10/15/2022   FREET4 0.90 05/25/2021   FREET4 1.06 11/12/2019   FREET4 0.85 07/04/2017   FREET4 1.1 06/11/2016   FREET4 0.88 12/11/2015   FREET4 0.78 07/14/2015  09/04/2018: TSH 28.027 (she was off LT4 after she ran out) 06/26/2014: TSH 0.45  She has GERD >> saw ENT >> throat irritation.  Pt does have a FH of thyroid ds. in daughter - thyroid cancer (? Type). No h/o radiation tx to head or neck.  She had a high HbA1c,of 6.5%. Most recent reportedly 6.0%. She was drinking juice before >> reduced amounts.  ROS: + See HPI  I reviewed pt's medications, allergies, PMH, social hx, family hx, and changes were documented in the history of present illness. Otherwise, unchanged from my initial visit note.  Past Medical History:  Diagnosis Date   Acute back pain    Acute hip pain    Anemia    hx of   Cancer ([facility]) 10/2014   papillary thyroid carcinoma-radiation after surgery   Colon polyp    Degenerative disc disease, lumbar    L5-S1   Fatigue    Hyperlipidemia    Osteoporosis    Thyroid mass    Thyroid nodule    Vitamin D deficiency    Past Surgical History:  Procedure Laterality Date   BIOPSY THYROID Bilateral 10/2014   COLONOSCOPY  01/24/14   ECTOPIC PREGNANCY SURGERY  1998   ENDOMETRIAL ABLATION  2010   LYMPH NODE DISSECTION N/A 11/29/2014   Procedure: LIMITED LYMPH NODE DISSECTION;  Surgeon: Darnell Level, MD;  Location: [facility];  Service: General;  Laterality: N/A;   SALPINGOOPHORECTOMY     one side   THYROIDECTOMY N/A 11/29/2014   Procedure: TOTAL THYROIDECTOMY LIMITED LYMPH NODE DISSECTION;  Surgeon: Darnell Level, MD;  Location: [facility];  Service: General;  Laterality: N/A;   TUBAL LIGATION  2011   History   Social History   Marital Status: Single    Spouse Name: N/A   Number of Children: 2   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      amLODipine (NORVASC) 5 MG tablet Take 5 mg by mouth daily.     Coenzyme Q10-Red Yeast Rice 60-600 MG CAPS Take 1 tablet by mouth 2 (two) times daily.     levothyroxine (SYNTHROID) 100 MCG tablet TAKE 1 TABLET BY MOUTH EVERY DAY *REFILLS NOT COVERED 90 tablet 2   OVER THE COUNTER MEDICATION Take 10 mLs by mouth every morning. Braggs Vinegar     OVER THE COUNTER MEDICATION Take 1 tablet by mouth 2 (two) times daily. PM Phytogen Complex     Tetrahydrozoline HCl (VISINE OP) Apply 1-2 drops to eye daily as  needed (red dry eyes.).     No current facility-administered medications on file prior to visit.   No Known Allergies Family History  Problem Relation Age of Onset   Diabetes Mother        DM Type II    Hyperlipidemia Mother    Hypertension Mother    Diabetes Father        DM Type II    Hyperlipidemia Father    Heart disease Father    Hypertension Father    PE: BP 120/64   Pulse 67   Ht 5\' 3"  (1.6 m)   Wt 201 lb (91.2 kg)   SpO2 97%   BMI 35.61 kg/m   Wt Readings from Last 3 Encounters:  10/17/23 201 lb (91.2 kg)  10/15/22 199 lb 9.6 oz (90.5 kg)  05/25/21 196 lb 6.4 oz (89.1 kg)   Constitutional: overweight, in NAD Eyes:  EOMI, no exophthalmos ENT: no neck masses, no cervical lymphadenopathy Cardiovascular: RRR, No MRG Respiratory: CTA B Musculoskeletal: no deformities Skin:no rashes Neurological: no tremor with outstretched hands  ASSESSMENT: 1. Follicular variant of Papillary thyroid cancer  2.  Postsurgical hypothyroidism   PLAN: 1. FV of PTC - Patient has low risk papillary thyroid cancer, the follicular variant.  This was also unifocal, it did not spread to the lymph vessels (0 out of 7 lymph nodes analyzed), she had RAI treatment in 03/2015, and posttreatment whole-body scan showed no metastasis -Reviewed the latest thyroid ultrasound report from 2022 and this did not show any recurrences or metastasis in her neck -No neck compression symptoms but has a slight pulling sensation on the right side of the scar, which is chronic.  This is most likely normal and due to scarring of the tissue behind the surgical incision -Previous thyroglobulin levels have been undetectable or barely detectable, and 0.1, including at last visit -We will recheck this today -We will recheck another ultrasound now  2. Postsurgical hypothyroidism - latest thyroid labs reviewed with pt >> normal in 2018, however, since then, she had another TSH that was very high, at 28.027 on 09/04/2018.  I was not aware of these results at that time, just saw this in her chart now... She tells me that at that time she was off levothyroxine for few days after running out.  - latest thyroid labs reviewed with pt. >> normal: Lab Results  Component Value Date   TSH 0.69 10/15/2022  - she continues on LT4 100 mcg daily - pt feels good on this dose, but still mentions inability to lose weight and hair loss, but which could be related to her shampoo.  She is interested in seeing dermatology.  I recommended [facility] dermatology office. - we discussed about taking the thyroid hormone every day, with water, >30 minutes before breakfast, separated by >4 hours from acid reflux medications, calcium, iron, multivitamins. Pt. is taking it correctly.  She occasionally forgets the medication but takes 2 tablets the next day. - will check thyroid tests today: TSH and fT4 - If labs are abnormal, she will need to return for repeat TFTs in 1.5  months  Needs refills.  Orders Placed This Encounter  Procedures   US THYROID   TSH   T4, free   Thyroglobulin Level   Thyroglobulin antibody   Component     Latest Ref Rng 10/17/2023  T4,Free(Direct)     0.8 - 1.8 ng/dL 1.3   Thyroglobulin     ng/mL 0.1 (L)  Comment --   Thyroglobulin Ab     < or = 1 IU/mL <1   TSH     0.40 - 4.50 mIU/L 0.67   Thyroglobulin level is stable, barely detectable.  TSH is at goal.  Neck U/S (11/03/2023): No residual thyroid tissue is identified in the thyroidectomy bed. No enlarged lymph nodes seen in the imaged portions of the neck.   IMPRESSION: No evidence of recurrent or residual thyroid malignancy.  Carlus Pavlov, MD PhD [facility] Endocrinology

## 2023-10-17 NOTE — Patient Instructions (Addendum)
 Please stop at the lab.  Please continue Levothyroxine 100 mcg daily.  Take the thyroid hormone every day, with water, at least 30 minutes before breakfast, separated by at least 4 hours from: - acid reflux medications - calcium - iron - multivitamins  [facility]  [facility], led by Dr. Langston Reusing, is set to bring expert care and comprehensive services to enhance your skin's health.  Make an Appointment Call [phone] to make an appointment.  Please return in 1 year.

## 2023-10-19 LAB — THYROGLOBULIN LEVEL: Thyroglobulin: 0.1 ng/mL — ABNORMAL LOW

## 2023-10-19 LAB — T4, FREE: Free T4: 1.3 ng/dL (ref 0.8–1.8)

## 2023-10-19 LAB — TSH: TSH: 0.67 m[IU]/L (ref 0.40–4.50)

## 2023-10-19 LAB — THYROGLOBULIN ANTIBODY: Thyroglobulin Ab: <1 [IU]/mL (ref <=1)

## 2023-10-20 MED ORDER — LEVOTHYROXINE SODIUM 100 MCG PO TABS: ORAL_TABLET | ORAL | 3 refills | Status: AC

## 2023-10-20 NOTE — Addendum Note (Signed)
 Addended by: Carlus Pavlov on: 10/20/2023 04:47 PM   Modules accepted: Orders

## 2023-11-03 ENCOUNTER — Ambulatory Visit
Admission: RE | Admit: 2023-11-03 | Discharge: 2023-11-03 | Disposition: A | Discharge status: Home / Self Care | Source: Ambulatory Visit | Attending: Internal Medicine | Admitting: Internal Medicine

## 2023-11-03 DIAGNOSIS — Z85850 Personal history of malignant neoplasm of thyroid: Secondary | ICD-10-CM | POA: Diagnosis not present

## 2023-11-03 DIAGNOSIS — C73 Malignant neoplasm of thyroid gland: Secondary | ICD-10-CM
